# Patient Record
Sex: Female | Born: 1995 | Race: Black or African American | Hispanic: No | Marital: Single | Smoking: Never smoker
Health system: Southern US, Community
[De-identification: ages and names within clinical notes are randomized; demographics above are authoritative.]

## PROBLEM LIST (undated history)

## (undated) DIAGNOSIS — B009 Herpesviral infection, unspecified: Secondary | ICD-10-CM

## (undated) HISTORY — DX: Herpesviral infection, unspecified: B00.9

---

## 2020-04-12 ENCOUNTER — Other Ambulatory Visit: Payer: Self-pay

## 2020-04-12 ENCOUNTER — Emergency Department (HOSPITAL_COMMUNITY)
Admission: EM | Admit: 2020-04-12 | Discharge: 2020-04-12 | Disposition: A | Discharge status: Home / Self Care | Payer: PRIVATE HEALTH INSURANCE | Attending: Emergency Medicine | Admitting: Emergency Medicine

## 2020-04-12 ENCOUNTER — Encounter (HOSPITAL_COMMUNITY): Payer: Self-pay | Admitting: Emergency Medicine

## 2020-04-12 DIAGNOSIS — Z3A01 Less than 8 weeks gestation of pregnancy: Secondary | ICD-10-CM

## 2020-04-12 DIAGNOSIS — Z3201 Encounter for pregnancy test, result positive: Secondary | ICD-10-CM | POA: Diagnosis not present

## 2020-04-12 DIAGNOSIS — Z3200 Encounter for pregnancy test, result unknown: Secondary | ICD-10-CM | POA: Diagnosis present

## 2020-04-12 LAB — I-STAT BETA HCG BLOOD, ED (MC, WL, AP ONLY): I-stat hCG, quantitative: 302.6 m[IU]/mL — ABNORMAL HIGH (ref <5)

## 2020-04-12 NOTE — ED Notes (Signed)
Patient verbalizes understanding of discharge instructions. Opportunity for questioning and answers were provided. Armband removed by staff, pt discharged from ED.  

## 2020-04-12 NOTE — ED Provider Notes (Addendum)
[facility] EMERGENCY DEPARTMENT Provider Note   CSN: [id number] Arrival date & time: 04/12/20  1521     History Chief Complaint  Patient presents with  . Possible Pregnancy    Taylor [name] is a 24 y.o. female with no past medical history who presents to the ED for pregnancy test.  LMP 03/06/2020.  Patient reports that she had already contacted [facility] here in [place], [place] to arrange for appointment with an OB/GYN provider, however they were instructed to have a documented positive pregnancy test for Medicaid purposes.  Patient states that she is not having any complaints at this time and had a positive at home pregnancy test last week.  She denies any significant abdominal pain, vaginal bleeding, uncontrolled nausea or vomiting, or other symptoms.  HPI     History reviewed. No pertinent past medical history.  There are no problems to display for this patient.   History reviewed. No pertinent surgical history.   OB History   No obstetric history on file.     No family history on file.  Social History   Tobacco Use  . Smoking status: Not on file  Substance Use Topics  . Alcohol use: Not on file  . Drug use: Not on file    Home Medications Prior to Admission medications   Not on File    Allergies    Patient has no known allergies.  Review of Systems   Review of Systems  All other systems reviewed and are negative.   Physical Exam Updated Vital Signs BP (!) 129/100 (BP Location: Left Wrist)   Pulse 94   Temp 98.2 F (36.8 C) (Oral)   Resp 17   Ht 5\' 10"  (1.778 m)   Wt 85.7 kg   LMP 03/06/2020   SpO2 99%   BMI 27.12 kg/m   Physical Exam Vitals and nursing note reviewed. Exam conducted with a chaperone present.  Constitutional:      Appearance: Normal appearance.  HENT:     Head: Normocephalic and atraumatic.  Eyes:     General: No scleral icterus.    Conjunctiva/sclera: Conjunctivae normal.  Cardiovascular:   Rate and Rhythm: Normal rate.  Pulmonary:     Effort: Pulmonary effort is normal.  Skin:    General: Skin is dry.  Neurological:     Mental Status: She is alert.     GCS: GCS eye subscore is 4. GCS verbal subscore is 5. GCS motor subscore is 6.  Psychiatric:        Mood and Affect: Mood normal.        Behavior: Behavior normal.        Thought Content: Thought content normal.     ED Results / Procedures / Treatments   Labs (all labs ordered are listed, but only abnormal results are displayed) Labs Reviewed  I-STAT BETA HCG BLOOD, ED ([facility], [facility], [facility] ONLY) - Abnormal; Notable for the following components:      Result Value   I-stat hCG, quantitative 302.6 (*)    All other components within normal limits    EKG None  Radiology No results found.  Procedures Procedures (including critical care time)  Medications Ordered in ED Medications - No data to display  ED Course  I have reviewed the triage vital signs and the nursing notes.  Pertinent labs & imaging results that were available during my care of the patient were reviewed by me and considered in my medical decision making (see chart  for details).    MDM Rules/Calculators/A&P                      Patient presents to the ED in order to confirm pregnancy.  Beta-hCG elevated 302.6, consistent with her reported timeline.  She is already had a positive at home pregnancy test and is in the process of being established with an OB/GYN.  She is now G1P0.  She has no complaints at this time.  She is accompanied by her boyfriend.  They report that this was a planned pregnancy and are excited about the diagnosis.  No abdominal pain or vaginal bleeding so low suspicion for ectopic pregnancy or other pregnancy complications.  Final Clinical Impression(s) / ED Diagnoses Final diagnoses:  Less than [redacted] weeks gestation of pregnancy    Rx / DC Orders ED Discharge Orders    None       Corena Herter, PA-C 04/12/20 1639    Corena Herter, PA-C 04/12/20 1650    Wyvonnia Dusky, MD 04/13/20 [phone]

## 2020-04-12 NOTE — Discharge Instructions (Signed)
Please go to your OB/GYN appointment, as scheduled.  Return to the ED or seek immediate medical attention should you experience any new or worsening symptoms -in particular, fevers, vaginal bleeding, lower abdominal pain, or uncontrolled nausea and vomiting.

## 2020-04-12 NOTE — ED Triage Notes (Signed)
Pt requesting pregnancy test, LMP 03/06/20.

## 2020-04-16 ENCOUNTER — Inpatient Hospital Stay (HOSPITAL_COMMUNITY)
Admission: AD | Admit: 2020-04-16 | Discharge: 2020-04-16 | Disposition: A | Discharge status: Home / Self Care | Payer: Medicaid Other | Attending: Obstetrics & Gynecology | Admitting: Obstetrics & Gynecology

## 2020-04-16 ENCOUNTER — Other Ambulatory Visit: Payer: Self-pay

## 2020-04-16 ENCOUNTER — Encounter (HOSPITAL_COMMUNITY): Payer: Self-pay | Admitting: Obstetrics & Gynecology

## 2020-04-16 ENCOUNTER — Inpatient Hospital Stay (HOSPITAL_COMMUNITY): Payer: Medicaid Other

## 2020-04-16 DIAGNOSIS — O209 Hemorrhage in early pregnancy, unspecified: Secondary | ICD-10-CM

## 2020-04-16 DIAGNOSIS — Z3A01 Less than 8 weeks gestation of pregnancy: Secondary | ICD-10-CM | POA: Diagnosis not present

## 2020-04-16 DIAGNOSIS — O0281 Inappropriate change in quantitative human chorionic gonadotropin (hCG) in early pregnancy: Secondary | ICD-10-CM

## 2020-04-16 DIAGNOSIS — O3680X0 Pregnancy with inconclusive fetal viability, not applicable or unspecified: Secondary | ICD-10-CM | POA: Diagnosis not present

## 2020-04-16 LAB — CBC
HCT: 37.9 % (ref 36.0–46.0)
Hemoglobin: 12.2 g/dL (ref 12.0–15.0)
MCH: 29.7 pg (ref 26.0–34.0)
MCHC: 32.2 g/dL (ref 30.0–36.0)
MCV: 92.2 fL (ref 80.0–100.0)
Platelets: 247 10*3/uL (ref 150–400)
RBC: 4.11 MIL/uL (ref 3.87–5.11)
RDW: 12.6 % (ref 11.5–15.5)
WBC: 6.8 10*3/uL (ref 4.0–10.5)
nRBC: 0 % (ref 0.0–0.2)

## 2020-04-16 LAB — URINALYSIS, ROUTINE W REFLEX MICROSCOPIC
Bacteria, UA: NONE SEEN
Bilirubin Urine: NEGATIVE
Glucose, UA: NEGATIVE mg/dL
Ketones, ur: NEGATIVE mg/dL
Leukocytes,Ua: NEGATIVE
Nitrite: NEGATIVE
Protein, ur: NEGATIVE mg/dL
Specific Gravity, Urine: 1.012 (ref 1.005–1.030)
pH: 6 (ref 5.0–8.0)

## 2020-04-16 LAB — WET PREP, GENITAL
Clue Cells Wet Prep HPF POC: NONE SEEN
Sperm: NONE SEEN
Trich, Wet Prep: NONE SEEN
Yeast Wet Prep HPF POC: NONE SEEN

## 2020-04-16 LAB — HCG, QUANTITATIVE, PREGNANCY: hCG, Beta Chain, Quant, S: 278 m[IU]/mL — ABNORMAL HIGH (ref <5)

## 2020-04-16 NOTE — ED Provider Notes (Signed)
Patient seen in triage for screening.  She had a positive pregnancy test in the ED on 04/12/2020 and presents today with vaginal bleeding that began today.  She reports it is very light.  She denies any pain, nausea, vomiting, or fevers.  Physical Exam  BP (!) 144/83 (BP Location: Right Arm)   Pulse 83   Temp 99.1 F (37.3 C) (Oral)   Resp 16   SpO2 99%   Physical Exam Vitals and nursing note reviewed.  Constitutional:      General: She is not in acute distress.    Appearance: She is well-developed.  HENT:     Head: Normocephalic and atraumatic.  Eyes:     General:        Right eye: No discharge.        Left eye: No discharge.     Conjunctiva/sclera: Conjunctivae normal.  Neck:     Vascular: No JVD.     Trachea: No tracheal deviation.  Cardiovascular:     Rate and Rhythm: Normal rate and regular rhythm.  Pulmonary:     Effort: Pulmonary effort is normal.     Breath sounds: Normal breath sounds.  Abdominal:     General: Abdomen is flat. There is no distension.     Palpations: Abdomen is soft.     Tenderness: There is no abdominal tenderness. There is no right CVA tenderness, left CVA tenderness, guarding or rebound.  Skin:    Findings: No erythema.  Neurological:     Mental Status: She is alert.  Psychiatric:        Behavior: Behavior normal.     ED Course/Procedures     Procedures  MDM  Patient is hemodynamically stable, resting comfortably.  No tenderness on examination.  Spoke with Sam at the MAU who agrees to transfer.       Jeanie Sewer, PA-C 04/16/20 1832    Terald Sleeper, MD 04/16/20 2224

## 2020-04-16 NOTE — MAU Provider Note (Signed)
History     CSN: [id number]  Arrival date and time: 04/16/20 1819   First Provider Initiated Contact with Patient 04/16/20 1924     Chief Complaint  Patient presents with  . Vaginal Bleeding   HPI Taylor [name] is a 24 y.o. G1P0 at [redacted]w[redacted]d who presents to MAU with chief complaint of vaginal spotting. This is a new problem, onset today. Patient endorses sexual intercourse this morning, bleeding started shortly afterward. No pain during sex or at any time today. She denies dysuria, abdominal tenderness, flank pain, fever or recent illness.  She is s/p pregnancy confirmed with Quant hCG of 302 at [facility] on 04/12/2020.  OB History    Gravida  1   Para      Term      Preterm      AB      Living        SAB      TAB      Ectopic      Multiple      Live Births              History reviewed. No pertinent past medical history.  History reviewed. No pertinent surgical history.  No family history on file.  Social History   Tobacco Use  . Smoking status: Never Smoker  . Smokeless tobacco: Never Used  Substance Use Topics  . Alcohol use: Never  . Drug use: Never    Allergies: No Known Allergies  No medications prior to admission.    Review of Systems  Gastrointestinal: Negative for abdominal pain.  Genitourinary: Positive for vaginal bleeding.  Musculoskeletal: Negative for back pain.  Neurological: Negative for dizziness, syncope and weakness.  All other systems reviewed and are negative.  Physical Exam   Blood pressure (!) 144/83, pulse 83, temperature 98.3 F (36.8 C), temperature source Oral, resp. rate 16, last menstrual period 03/06/2020, SpO2 99 %.  Physical Exam  Nursing note and vitals reviewed. Constitutional: She is oriented to person, place, and time. She appears well-developed and well-nourished.  Cardiovascular: Normal rate, normal heart sounds and intact distal pulses.  Respiratory: Effort normal and breath sounds normal.  GI: Soft.  Bowel sounds are normal. She exhibits no distension. There is no abdominal tenderness. There is no rebound and no guarding.  Genitourinary:    No vaginal discharge.   Neurological: She is alert and oriented to person, place, and time.  Skin: Skin is warm and dry.  Psychiatric: She has a normal mood and affect. Her behavior is normal. Judgment and thought content normal.    MAU Course/MDM  Procedures  --Swabs collected via blind swab with RN. Speculum exam declined by patient --iStat Quant in [facility] 302.6 on 04/12/2020. Quant hCG 278 this evening. Discussed with patient significance of rise in Quant hCG q 48 hours in the first 10 weeks of pregnancy.  Patient Vitals for the past 24 hrs:  BP Temp Temp src Pulse Resp SpO2  04/16/20 2029 -- -- -- 81 -- --  04/16/20 1924 133/71 -- -- 94 -- --  04/16/20 1917 127/72 -- -- 89 -- --  04/16/20 1914 -- 98.3 F (36.8 C) Oral -- 16 99 %  04/16/20 1824 (!) 144/83 99.1 F (37.3 C) Oral 83 16 99 %   Results for orders placed or performed during the hospital encounter of 04/16/20 (from the past 24 hour(s))  CBC     Status: None   Collection Time: 04/16/20  7:17 PM  Result Value  Ref Range   WBC 6.8 4.0 - 10.5 K/uL   RBC 4.11 3.87 - 5.11 MIL/uL   Hemoglobin 12.2 12.0 - 15.0 g/dL   HCT 23.7 62.8 - 31.5 %   MCV 92.2 80.0 - 100.0 fL   MCH 29.7 26.0 - 34.0 pg   MCHC 32.2 30.0 - 36.0 g/dL   RDW 17.6 16.0 - 73.7 %   Platelets 247 150 - 400 K/uL   nRBC 0.0 0.0 - 0.2 %  hCG, quantitative, pregnancy     Status: Abnormal   Collection Time: 04/16/20  7:17 PM  Result Value Ref Range   hCG, Beta Chain, Quant, S 278 (H) <5 mIU/mL  ABO/Rh     Status: None   Collection Time: 04/16/20  7:17 PM  Result Value Ref Range   ABO/RH(D)      O POS Performed at [facility], [address], [place], [place] [address]   Urinalysis, Routine w reflex microscopic     Status: Abnormal   Collection Time: 04/16/20  7:26 PM  Result Value Ref Range   Color, Urine  YELLOW YELLOW   APPearance CLEAR CLEAR   Specific Gravity, Urine 1.012 1.005 - 1.030   pH 6.0 5.0 - 8.0   Glucose, UA NEGATIVE NEGATIVE mg/dL   Hgb urine dipstick MODERATE (A) NEGATIVE   Bilirubin Urine NEGATIVE NEGATIVE   Ketones, ur NEGATIVE NEGATIVE mg/dL   Protein, ur NEGATIVE NEGATIVE mg/dL   Nitrite NEGATIVE NEGATIVE   Leukocytes,Ua NEGATIVE NEGATIVE   RBC / HPF 6-10 0 - 5 RBC/hpf   WBC, UA 0-5 0 - 5 WBC/hpf   Bacteria, UA NONE SEEN NONE SEEN   Squamous Epithelial / LPF 0-5 0 - 5   Mucus PRESENT   Wet prep, genital     Status: Abnormal   Collection Time: 04/16/20  7:34 PM   Specimen: Vaginal  Result Value Ref Range   Yeast Wet Prep HPF POC NONE SEEN NONE SEEN   Trich, Wet Prep NONE SEEN NONE SEEN   Clue Cells Wet Prep HPF POC NONE SEEN NONE SEEN   WBC, Wet Prep HPF POC MODERATE (A) NONE SEEN   Sperm NONE SEEN    Assessment and Plan  --25 y.o. G1P0 at 109w6d with pregnancy of unknown location --Concern for slightly decreasing quant hCG --O POS, Rhogam not indicated --Discharge home in stable condition  F/U: --State Quant hCG Tuesday morning at [facility]  Calvert Cantor, CNM 04/16/2020, 8:35 PM

## 2020-04-16 NOTE — Discharge Instructions (Signed)
First Trimester of Pregnancy  The first trimester of pregnancy is from week 1 until the end of week 13 (months 1 through 3). During this time, your baby will begin to develop inside you. At 6-8 weeks, the eyes and face are formed, and the heartbeat can be seen on ultrasound. At the end of 12 weeks, all the baby's organs are formed. Prenatal care is all the medical care you receive before the birth of your baby. Make sure you get good prenatal care and follow all of your doctor's instructions. Follow these instructions at home: Medicines  Take over-the-counter and prescription medicines only as told by your doctor. Some medicines are safe and some medicines are not safe during pregnancy.  Take a prenatal vitamin that contains at least 600 micrograms (mcg) of folic acid.  If you have trouble pooping (constipation), take medicine that will make your stool soft (stool softener) if your doctor approves. Eating and drinking   Eat regular, healthy meals.  Your doctor will tell you the amount of weight gain that is right for you.  Avoid raw meat and uncooked cheese.  If you feel sick to your stomach (nauseous) or throw up (vomit): ? Eat 4 or 5 small meals a day instead of 3 large meals. ? Try eating a few soda crackers. ? Drink liquids between meals instead of during meals.  To prevent constipation: ? Eat foods that are high in fiber, like fresh fruits and vegetables, whole grains, and beans. ? Drink enough fluids to keep your pee (urine) clear or pale yellow. Activity  Exercise only as told by your doctor. Stop exercising if you have cramps or pain in your lower belly (abdomen) or low back.  Do not exercise if it is too hot, too humid, or if you are in a place of great height (high altitude).  Try to avoid standing for long periods of time. Move your legs often if you must stand in one place for a long time.  Avoid heavy lifting.  Wear low-heeled shoes. Sit and stand up  straight.  You can have sex unless your doctor tells you not to. Relieving pain and discomfort  Wear a good support bra if your breasts are sore.  Take warm water baths (sitz baths) to soothe pain or discomfort caused by hemorrhoids. Use hemorrhoid cream if your doctor says it is okay.  Rest with your legs raised if you have leg cramps or low back pain.  If you have puffy, bulging veins (varicose veins) in your legs: ? Wear support hose or compression stockings as told by your doctor. ? Raise (elevate) your feet for 15 minutes, 3-4 times a day. ? Limit salt in your food. Prenatal care  Schedule your prenatal visits by the twelfth week of pregnancy.  Write down your questions. Take them to your prenatal visits.  Keep all your prenatal visits as told by your doctor. This is important. Safety  Wear your seat belt at all times when driving.  Make a list of emergency phone numbers. The list should include numbers for family, friends, the hospital, and police and fire departments. General instructions  Ask your doctor for a referral to a local prenatal class. Begin classes no later than at the start of month 6 of your pregnancy.  Ask for help if you need counseling or if you need help with nutrition. Your doctor can give you advice or tell you where to go for help.  Do not use hot tubs, steam   rooms, or saunas.  Do not douche or use tampons or scented sanitary pads.  Do not cross your legs for long periods of time.  Avoid all herbs and alcohol. Avoid drugs that are not approved by your doctor.  Do not use any tobacco products, including cigarettes, chewing tobacco, and electronic cigarettes. If you need help quitting, ask your doctor. You may get counseling or other support to help you quit.  Avoid cat litter boxes and soil used by cats. These carry germs that can cause birth defects in the baby and can cause a loss of your baby (miscarriage) or stillbirth.  Visit your dentist.  At home, brush your teeth with a soft toothbrush. Be gentle when you floss. Contact a doctor if:  You are dizzy.  You have mild cramps or pressure in your lower belly.  You have a nagging pain in your belly area.  You continue to feel sick to your stomach, you throw up, or you have watery poop (diarrhea).  You have a bad smelling fluid coming from your vagina.  You have pain when you pee (urinate).  You have increased puffiness (swelling) in your face, hands, legs, or ankles. Get help right away if:  You have a fever.  You are leaking fluid from your vagina.  You have spotting or bleeding from your vagina.  You have very bad belly cramping or pain.  You gain or lose weight rapidly.  You throw up blood. It may look like coffee grounds.  You are around people who have [place] measles, fifth disease, or chickenpox.  You have a very bad headache.  You have shortness of breath.  You have any kind of trauma, such as from a fall or a car accident. Summary  The first trimester of pregnancy is from week 1 until the end of week 13 (months 1 through 3).  To take care of yourself and your unborn baby, you will need to eat healthy meals, take medicines only if your doctor tells you to do so, and do activities that are safe for you and your baby.  Keep all follow-up visits as told by your doctor. This is important as your doctor will have to ensure that your baby is healthy and growing well. This information is not intended to replace advice given to you by your health care provider. Make sure you discuss any questions you have with your health care provider. Document Revised: 03/12/2019 Document Reviewed: 11/27/2016 Elsevier Patient Education  2020 Elsevier Inc.    Vaginal Bleeding During Pregnancy, First Trimester  A small amount of bleeding (spotting) from the vagina is common during early pregnancy. Sometimes the bleeding is normal and does not cause problems. At other  times, though, bleeding may be a sign of something serious. Tell your doctor about any bleeding from your vagina right away. Follow these instructions at home: Activity  Follow your doctor's instructions about how active you can be.  If needed, make plans for someone to help with your normal activities.  Do not have sex or orgasms until your doctor says that this is safe. General instructions  Take over-the-counter and prescription medicines only as told by your doctor.  Watch your condition for any changes.  Write down: ? The number of pads you use each day. ? How often you change pads. ? How soaked (saturated) your pads are.  Do not use tampons.  Do not douche.  If you pass any tissue from your vagina, save it to show  to your doctor.  Keep all follow-up visits as told by your doctor. This is important. Contact a doctor if:  You have vaginal bleeding at any time while you are pregnant.  You have cramps.  You have a fever. Get help right away if:  You have very bad cramps in your back or belly (abdomen).  You pass large clots or a lot of tissue from your vagina.  Your bleeding gets worse.  You feel light-headed.  You feel weak.  You pass out (faint).  You have chills.  You are leaking fluid from your vagina.  You have a gush of fluid from your vagina. Summary  Sometimes vaginal bleeding during pregnancy is normal and does not cause problems. At other times, bleeding may be a sign of something serious.  Tell your doctor about any bleeding from your vagina right away.  Follow your doctor's instructions about how active you can be. You may need someone to help you with your normal activities. This information is not intended to replace advice given to you by your health care provider. Make sure you discuss any questions you have with your health care provider. Document Revised: 03/10/2019 Document Reviewed: 02/20/2017 Elsevier Patient Education  [place] Medications in Pregnancy   Acne: Benzoyl Peroxide Salicylic Acid  Backache/Headache: Tylenol: 2 regular strength every 4 hours OR              2 Extra strength every 6 hours  Colds/Coughs/Allergies: Benadryl (alcohol free) 25 mg every 6 hours as needed Breath right strips Claritin Cepacol throat lozenges Chloraseptic throat spray Cold-Eeze- up to three times per day Cough drops, alcohol free Flonase (by prescription only) Guaifenesin Mucinex Robitussin DM (plain only, alcohol free) Saline nasal spray/drops Sudafed (pseudoephedrine) & Actifed ** use only after [redacted] weeks gestation and if you do not have high blood pressure Tylenol Vicks Vaporub Zinc lozenges Zyrtec   Constipation: Colace Ducolax suppositories Fleet enema Glycerin suppositories Metamucil Milk of magnesia Miralax Senokot Smooth move tea  Diarrhea: Kaopectate Imodium A-D  *NO pepto Bismol  Hemorrhoids: Anusol Anusol HC Preparation H Tucks  Indigestion: Tums Maalox Mylanta Zantac  Pepcid  Insomnia: Benadryl (alcohol free) 25mg  every 6 hours as needed Tylenol PM Unisom, no Gelcaps  Leg Cramps: Tums MagGel  Nausea/Vomiting:  Bonine Dramamine Emetrol Ginger extract Sea bands Meclizine  Nausea medication to take during pregnancy:  Unisom (doxylamine succinate 25 mg tablets) Take one tablet daily at bedtime. If symptoms are not adequately controlled, the dose can be increased to a maximum recommended dose of two tablets daily (1/2 tablet in the morning, 1/2 tablet mid-afternoon and one at bedtime). Vitamin B6 100mg  tablets. Take one tablet twice a day (up to 200 mg per day).  Skin Rashes: Aveeno products Benadryl cream or 25mg  every 6 hours as needed Calamine Lotion 1% cortisone cream  Yeast infection: Gyne-lotrimin 7 Monistat 7   **If taking multiple medications, please check labels to avoid duplicating the same active ingredients **take  medication as directed on the label ** Do not exceed 4000 mg of tylenol in 24 hours **Do not take medications that contain aspirin or ibuprofen

## 2020-04-16 NOTE — ED Triage Notes (Signed)
Pt seen here on 5/11 with positive pregnancy test. Pt c/o vaginal bleeding that started today.

## 2020-04-16 NOTE — MAU Note (Signed)
Pt reports to MAU from ED with some light VB that is red, denies pain.

## 2020-04-18 ENCOUNTER — Other Ambulatory Visit: Payer: Self-pay | Admitting: Advanced Practice Midwife

## 2020-04-18 LAB — ABO/RH: ABO/RH(D): O POS

## 2020-04-18 LAB — GC/CHLAMYDIA PROBE AMP (~~LOC~~) NOT AT ARMC
Chlamydia: POSITIVE — AB
Comment: NEGATIVE
Comment: NORMAL
Neisseria Gonorrhea: NEGATIVE

## 2020-04-18 MED ORDER — AZITHROMYCIN 500 MG PO TABS: 1000.0000 mg | ORAL_TABLET | Freq: Once | ORAL | 0 refills | Status: AC

## 2020-04-18 NOTE — Progress Notes (Signed)
+   Chlamydia, patient notified via phone. Discussed recommendation for treatment and immediate partner treatment due to rates of reinfection. 100% condom use advised due to risk of complications. Patient verbalized understanding. Has appointment for repeat stat Quant hCG at [facility] tomorrow 04/19/2020.  Clayton Bibles, MSN, CNM Certified Nurse Midwife, [employer] for [employer], [facility] 04/18/20 3:03 PM

## 2020-04-19 ENCOUNTER — Other Ambulatory Visit (INDEPENDENT_AMBULATORY_CARE_PROVIDER_SITE_OTHER): Payer: PRIVATE HEALTH INSURANCE | Admitting: *Deleted

## 2020-04-19 ENCOUNTER — Other Ambulatory Visit: Payer: Self-pay

## 2020-04-19 ENCOUNTER — Telehealth: Payer: Self-pay | Admitting: Advanced Practice Midwife

## 2020-04-19 ENCOUNTER — Encounter: Payer: Self-pay | Admitting: Advanced Practice Midwife

## 2020-04-19 VITALS — BP 126/77 | HR 87 | Temp 98.4°F | Ht 70.0 in | Wt 198.0 lb

## 2020-04-19 DIAGNOSIS — A749 Chlamydial infection, unspecified: Secondary | ICD-10-CM | POA: Insufficient documentation

## 2020-04-19 DIAGNOSIS — O3680X0 Pregnancy with inconclusive fetal viability, not applicable or unspecified: Secondary | ICD-10-CM

## 2020-04-19 LAB — BETA HCG QUANT (REF LAB): hCG Quant: 183 m[IU]/mL

## 2020-04-19 NOTE — Progress Notes (Signed)
   Taylor [name] presents to [facility] for follow-up quant hCG blood draw today. She was seen in MAU for vaginal bleeding on 04/16/2020. Beta Hcg on 04/16/20 278 and 04/12/20 302.6. Patient denies abdominal pain endorses bleeding (spotting) today. Discussed with patient, we are following hCG levels today. Results will be back in approximately 2 hours. Valid contact number for patient confirmed. I will call the patient with results.     Clovis Pu 04/19/2020 8:44 AM

## 2020-04-19 NOTE — Telephone Encounter (Signed)
Patient called Taylor [name] and asked to speak with me. Denies pain, states bleeding is negligible with nothing visible on pad. Reviewed hCG results and confirmed they are trending down, suggesting non-viable pregnancy. Reviewed all labs from 04/16/2020, protocol involving following quant hCG until <5 with repeat ultrasound PRN. Patient and boyfriend participating in phone call. Verbalized understanding and deny questions at end of our 15 minute call.  F/U: Patient returning to [facility] Thursday 05/20 for repeat Quant hCG  Clayton Bibles, MSN, CNM Certified Nurse Midwife, [facility] 04/19/20 3:53 PM

## 2020-04-19 NOTE — Progress Notes (Signed)
   Called patient regarding stat Beta Hcg. Patient verified DOB. Informed patient that blood level has decreased. Patient asked what does that mean, informed that patient that she is currently having a miscarriage and she should come back to clinic on Thursday for repeat lab draw. Explained to patient the reason for repeat lab and when to return to MAU. Patient voiced understand. Appointment Thursday 04/22/2020 at 9 AM for stat Beta Hcg.  Clovis Pu, RN

## 2020-04-21 ENCOUNTER — Other Ambulatory Visit (INDEPENDENT_AMBULATORY_CARE_PROVIDER_SITE_OTHER): Payer: PRIVATE HEALTH INSURANCE | Admitting: *Deleted

## 2020-04-21 ENCOUNTER — Telehealth: Payer: PRIVATE HEALTH INSURANCE | Admitting: Emergency Medicine

## 2020-04-21 ENCOUNTER — Other Ambulatory Visit: Payer: Self-pay

## 2020-04-21 ENCOUNTER — Telehealth: Payer: Self-pay | Admitting: Obstetrics and Gynecology

## 2020-04-21 ENCOUNTER — Inpatient Hospital Stay (HOSPITAL_COMMUNITY)
Admission: AD | Admit: 2020-04-21 | Discharge: 2020-04-21 | Disposition: A | Discharge status: Home / Self Care | Payer: PRIVATE HEALTH INSURANCE | Attending: Obstetrics and Gynecology | Admitting: Obstetrics and Gynecology

## 2020-04-21 ENCOUNTER — Inpatient Hospital Stay (HOSPITAL_COMMUNITY): Payer: PRIVATE HEALTH INSURANCE

## 2020-04-21 VITALS — BP 127/79 | HR 85 | Temp 98.1°F | Wt 198.8 lb

## 2020-04-21 DIAGNOSIS — O3680X0 Pregnancy with inconclusive fetal viability, not applicable or unspecified: Secondary | ICD-10-CM

## 2020-04-21 DIAGNOSIS — N898 Other specified noninflammatory disorders of vagina: Secondary | ICD-10-CM

## 2020-04-21 DIAGNOSIS — Z331 Pregnant state, incidental: Secondary | ICD-10-CM | POA: Diagnosis present

## 2020-04-21 LAB — CBC WITH DIFFERENTIAL/PLATELET
Abs Immature Granulocytes: 0.01 10*3/uL (ref 0.00–0.07)
Basophils Absolute: 0 10*3/uL (ref 0.0–0.1)
Basophils Relative: 1 %
Eosinophils Absolute: 0.1 10*3/uL (ref 0.0–0.5)
Eosinophils Relative: 2 %
HCT: 39.4 % (ref 36.0–46.0)
Hemoglobin: 12.5 g/dL (ref 12.0–15.0)
Immature Granulocytes: 0 %
Lymphocytes Relative: 51 %
Lymphs Abs: 2.6 10*3/uL (ref 0.7–4.0)
MCH: 29.7 pg (ref 26.0–34.0)
MCHC: 31.7 g/dL (ref 30.0–36.0)
MCV: 93.6 fL (ref 80.0–100.0)
Monocytes Absolute: 0.4 10*3/uL (ref 0.1–1.0)
Monocytes Relative: 7 %
Neutro Abs: 2.1 10*3/uL (ref 1.7–7.7)
Neutrophils Relative %: 39 %
Platelets: 255 10*3/uL (ref 150–400)
RBC: 4.21 MIL/uL (ref 3.87–5.11)
RDW: 12.7 % (ref 11.5–15.5)
WBC: 5.2 10*3/uL (ref 4.0–10.5)
nRBC: 0 % (ref 0.0–0.2)

## 2020-04-21 LAB — COMPREHENSIVE METABOLIC PANEL
ALT: 20 U/L (ref 0–44)
AST: 21 U/L (ref 15–41)
Albumin: 3.9 g/dL (ref 3.5–5.0)
Alkaline Phosphatase: 50 U/L (ref 38–126)
Anion gap: 8 (ref 5–15)
BUN: 6 mg/dL (ref 6–20)
CO2: 27 mmol/L (ref 22–32)
Calcium: 9.1 mg/dL (ref 8.9–10.3)
Chloride: 102 mmol/L (ref 98–111)
Creatinine, Ser: 0.95 mg/dL (ref 0.44–1.00)
GFR calc Af Amer: >60 mL/min (ref >60)
GFR calc non Af Amer: >60 mL/min (ref >60)
Glucose, Bld: 93 mg/dL (ref 70–99)
Potassium: 3.9 mmol/L (ref 3.5–5.1)
Sodium: 137 mmol/L (ref 135–145)
Total Bilirubin: 0.7 mg/dL (ref 0.3–1.2)
Total Protein: 7.2 g/dL (ref 6.5–8.1)

## 2020-04-21 LAB — BETA HCG QUANT (REF LAB): hCG Quant: 229 m[IU]/mL

## 2020-04-21 NOTE — MAU Provider Note (Signed)
Ms. Taylor [name]  is a 24 y.o. G1P0 at [redacted]w[redacted]d who presents to MAU today for evaluation of abnormal serial quant HCGs. She was originally seen in the ED for a pregnancy test on 04/12/20 and qhcg was 302. She was seen again on 04/16/20 in MAU for spotting and had [place] which showed IUGS but no YS or FP, qhcg was 278. Serial qhcg q48 hrs were 183 on 04/19/20 and 229 today. She denies pain, vaginal bleeding or fever today.   OB History  Gravida Para Term Preterm AB Living  1            SAB TAB Ectopic Multiple Live Births               # Outcome Date GA Lbr Len/2nd Weight Sex Delivery Anes PTL Lv  1 Current             No past medical history on file.  ROS: no VB no pain  BP 120/76   Pulse 88   Resp 18   Ht 5\' 10"  (1.778 m)   Wt 89.4 kg   LMP 03/06/2020   BMI 28.27 kg/m   CONSTITUTIONAL: Well-developed, well-nourished female in no acute distress.  MUSCULOSKELETAL: Normal range of motion.  CARDIOVASCULAR: Regular heart rate RESPIRATORY: Normal effort NEUROLOGICAL: Alert and oriented to person, place, and time.  SKIN: Not diaphoretic. No erythema. No pallor. PSYCH: Normal mood and affect. Normal behavior. Normal judgment and thought content.  Results for orders placed or performed during the hospital encounter of 04/21/20 (from the past 24 hour(s))  CBC WITH DIFFERENTIAL     Status: None   Collection Time: 04/21/20  1:57 PM  Result Value Ref Range   WBC 5.2 4.0 - 10.5 K/uL   RBC 4.21 3.87 - 5.11 MIL/uL   Hemoglobin 12.5 12.0 - 15.0 g/dL   HCT 04/23/20 98.3 - 38.2 %   MCV 93.6 80.0 - 100.0 fL   MCH 29.7 26.0 - 34.0 pg   MCHC 31.7 30.0 - 36.0 g/dL   RDW 50.5 39.7 - 67.3 %   Platelets 255 150 - 400 K/uL   nRBC 0.0 0.0 - 0.2 %   Neutrophils Relative % 39 %   Neutro Abs 2.1 1.7 - 7.7 K/uL   Lymphocytes Relative 51 %   Lymphs Abs 2.6 0.7 - 4.0 K/uL   Monocytes Relative 7 %   Monocytes Absolute 0.4 0.1 - 1.0 K/uL   Eosinophils Relative 2 %   Eosinophils Absolute 0.1 0.0 - 0.5 K/uL    Basophils Relative 1 %   Basophils Absolute 0.0 0.0 - 0.1 K/uL   Immature Granulocytes 0 %   Abs Immature Granulocytes 0.01 0.00 - 0.07 K/uL  Comprehensive metabolic panel     Status: None   Collection Time: 04/21/20  1:57 PM  Result Value Ref Range   Sodium 137 135 - 145 mmol/L   Potassium 3.9 3.5 - 5.1 mmol/L   Chloride 102 98 - 111 mmol/L   CO2 27 22 - 32 mmol/L   Glucose, Bld 93 70 - 99 mg/dL   BUN 6 6 - 20 mg/dL   Creatinine, Ser 04/23/20 0.44 - 1.00 mg/dL   Calcium 9.1 8.9 - 3.79 mg/dL   Total Protein 7.2 6.5 - 8.1 g/dL   Albumin 3.9 3.5 - 5.0 g/dL   AST 21 15 - 41 U/L   ALT 20 0 - 44 U/L   Alkaline Phosphatase 50 38 - 126 U/L   Total Bilirubin  0.7 0.3 - 1.2 mg/dL   GFR calc non Af Amer >60 >60 mL/min   GFR calc Af Amer >60 >60 mL/min   Anion gap 8 5 - 15   US OB Transvaginal  Result Date: 04/21/2020 CLINICAL DATA:  Pregnancy of unknown location. No pain or vaginal bleeding. Quantitative beta HCG 229 today (04/16/2020-278, 04/19/2020-183). Estimated gestational age per LMP 6 weeks 4 days. EXAM: TRANSVAGINAL OB ULTRASOUND TECHNIQUE: Transvaginal ultrasound was performed for complete evaluation of the gestation as well as the maternal uterus, adnexal regions, and pelvic cul-de-sac. COMPARISON:  04/16/2020 FINDINGS: Intrauterine gestational sac: Single oval cystic structure over the endometrium unchanged and may represent a gestational sac. Yolk sac:  Not visualized. Embryo:  Not visualized. Cardiac Activity: Not visualized. Heart Rate: Not visualized.  Bpm MSD: 2.3 mm   4 w   6 d Subchorionic hemorrhage:  None visualized. Maternal uterus/adnexae: Ovaries are normal size, shape and position with normal color Doppler bilaterally. No extra ovarian abnormality within the adnexa. No free fluid. IMPRESSION: 1. Possible intrauterine gestational sac with mean sac diameter compatible with estimated gestational age [redacted] weeks 6 days which is unchanged. No other findings to suggest ectopic pregnancy.  Given clinical findings, this likely represents failed pregnancy, although normal early pregnancy is still possible. Consider continued serial quantitative beta HCG and follow-up ultrasound 7-10 days. Electronically Signed   By: Marin Olp M.D.   On: 04/21/2020 16:23   MDM: Review of quants: 04/12/20 qhcg 302                                         04/16/20 qhcg 278; +IUGS, no YS or FP                                         04/19/20 qhcg 183                                         04/21/20 qhcg 229  Consult with Dr. Elly Modena, recommends repeating imaging. Possible IUGS seen on [place] again today. Consult with Dr. Elly Modena, recommends rpt qhcg in 2 days in MAU. Discussed plan with pt. Stable for discharge home.   A: 1. Pregnancy of unknown anatomic location    P: Discharge home Ectopic precautions discussed Patient will return for follow-up quant HCG in MAU on 04/23/20 Patient may return to MAU as needed or if her condition were to change or worsen   Allergies as of 04/21/2020   No Known Allergies     Medication List    You have not been prescribed any medications.     Julianne Handler, CNM 04/21/2020 4:39 PM

## 2020-04-21 NOTE — Progress Notes (Signed)
   Ms. Taylor [name] presents to [facility] for follow-up quant hCG blood draw today. She was seen in MAU for vaginal bleeding on 04/16/2020. Patient denies/endorses abdominal pain or bleeding today. Discussed with patient, we are following hCG levels today. Beta Hcg on 04/16/2020 278, 04/12/2020 302.6 and 04/19/2020 183. Results will be back in approximately 2 hours. Valid contact number for patient confirmed. I will call the patient with results.     Clovis Pu 04/21/2020 8:42 AM

## 2020-04-21 NOTE — MAU Note (Signed)
Pt sent from office for MTX for ectopic pregnancy. Denies any vag bleeding or pain at this time.

## 2020-04-21 NOTE — Progress Notes (Signed)
Vaginal discharge in the setting of possible pregnancy is not something that can be properly evaluated or treated during an e-visit. Please make an appointment with your doctor or OBGYN.  Based on what you shared with me, I feel your condition warrants further evaluation and I recommend that you be seen for a face to face visit.  Please contact your primary care physician practice to be seen. Many offices offer virtual options to be seen via video if you are not comfortable going in person to a medical facility at this time.  If you do not have a PCP, [facility] offers a free physician referral service available at [phone]. Our trained staff has the experience, knowledge and resources to put you in touch with a physician who is right for you.   You also have the option of a video visit through [email or web address]  If you are having a true medical emergency please call 911.  NOTE: If you entered your credit card information for this eVisit, you will not be charged. You may see a "hold" on your card for the $35 but that hold will drop off and you will not have a charge processed.  Your e-visit answers were reviewed by a board certified advanced clinical practitioner to complete your personal care plan.  Thank you for using e-Visits.  Approximately 5 minutes was used in reviewing the patient's chart, questionnaire, prescribing medications, and documentation.

## 2020-04-21 NOTE — Telephone Encounter (Signed)
*  Consult with Dr. Jolayne Panther @ 1139 - notified of patient's complaints, lab & U/S results, recommended tx plan have patient report to MAU for MTX.  TC to patient at 1141: reviewed lab results, discussed Dr. Deretha Emory recommendation to go to MAU for MTX injection. Explained ectopic pregnancy, rationale for MTX use for ectopic pregnancy. Advised that taking MTX will not cause her not to get pregnant again. Stressed the importance of going now to MAU for MTX. Patient verbalized an understanding of the plan of care and agrees.   MAU Provider, Donette Larry, CNM & Harley Alto, RN  Notified of patient coming.  Raelyn Mora, CNM

## 2020-04-23 ENCOUNTER — Inpatient Hospital Stay (HOSPITAL_COMMUNITY)
Admission: AD | Admit: 2020-04-23 | Discharge: 2020-04-23 | Disposition: A | Discharge status: Home / Self Care | Payer: PRIVATE HEALTH INSURANCE | Attending: Obstetrics and Gynecology | Admitting: Obstetrics and Gynecology

## 2020-04-23 ENCOUNTER — Other Ambulatory Visit: Payer: Self-pay

## 2020-04-23 DIAGNOSIS — Z3A01 Less than 8 weeks gestation of pregnancy: Secondary | ICD-10-CM

## 2020-04-23 DIAGNOSIS — O0281 Inappropriate change in quantitative human chorionic gonadotropin (hCG) in early pregnancy: Secondary | ICD-10-CM | POA: Diagnosis not present

## 2020-04-23 DIAGNOSIS — Z3401 Encounter for supervision of normal first pregnancy, first trimester: Secondary | ICD-10-CM | POA: Insufficient documentation

## 2020-04-23 DIAGNOSIS — O3680X0 Pregnancy with inconclusive fetal viability, not applicable or unspecified: Secondary | ICD-10-CM

## 2020-04-23 LAB — HCG, QUANTITATIVE, PREGNANCY: hCG, Beta Chain, Quant, S: 315 m[IU]/mL — ABNORMAL HIGH (ref <5)

## 2020-04-23 NOTE — Discharge Instructions (Signed)

## 2020-04-23 NOTE — MAU Provider Note (Signed)
History   Chief Complaint:  Follow-up  Taylor [name] is  24 y.o. G1P0 Patient's last menstrual period was 03/06/2020.Marland Kitchen Patient is here for follow up of quantitative HCG and ongoing surveillance of pregnancy status. She is [redacted]w[redacted]d weeks gestation  by LMP.    Since her last visit, the patient is without new complaint. The patient reports bleeding as  none now.  She denies any pain.  General ROS:  negative  Her previous Quantitative HCG values are:  5/11: 302 5/15: 278 5/18: 183 5/20: 229  Physical Exam   Blood pressure 126/69, pulse 80, temperature 99.3 F (37.4 C), temperature source Oral, resp. rate 18, last menstrual period 03/06/2020, SpO2 99 %.  Focused Gynecological Exam: examination not indicated  Labs: Results for orders placed or performed during the hospital encounter of 04/23/20 (from the past 24 hour(s))  hCG, quantitative, pregnancy   Collection Time: 04/23/20 12:38 PM  Result Value Ref Range   hCG, Beta Chain, Quant, S 315 (H) <5 mIU/mL   Assessment:   1. Pregnancy of unknown anatomic location     Consulted with Dr. Jolayne Panther- recommends MTX for abnormally rising HCGs. Lengthy discussion with patient regarding results and recommendations. Patient concerned because levels are rising and does not want MTX if pregnancy is normal. Fears validated and discussed hormone levels indicative of abnormally developing pregnancy, likely ectopic and reviewed normal pregnancy levels. Patient refuses MTX and wants to continue to repeat HCGs until a definitive diagnosis can be made. Lengthy discussion with patient regarding warning signs of ectopic and when to immediately return to MAU.  Plan: -Discharge home in stable condition -Strict ectopic precautions discussed -Patient advised to follow-up with [facility] on  Monday at 9a -Patient may return to MAU as needed or if her condition were to change or worsen  Rolm Bookbinder, CNM 04/23/2020, 1:28 PM

## 2020-04-23 NOTE — MAU Note (Signed)
Taylor [name] is a 24 y.o. at [redacted]w[redacted]d here in MAU reporting:  For follow up lab work from MAU visit on 04/21/20. Denies vaginal bleeding or pain.   Vitals:   04/23/20 1234  BP: 126/69  Pulse: 80  Resp: 18  Temp: 99.3 F (37.4 C)  SpO2: 99%

## 2020-04-24 ENCOUNTER — Telehealth: Payer: Self-pay | Admitting: Advanced Practice Midwife

## 2020-04-24 NOTE — Telephone Encounter (Signed)
Patient and partner contacted per their request to speak to me. Lab values, recommendation for MTX reviewed with both parties. Affirmed recommendation from C. Neill and Dr Jolayne Panther. Discussed acuity or ectopic pregnancies, potential impact on health and fertility. Patient verbalizes intent to return to MAU tomorrow 04/25/2020 for MTX administration.  Clayton Bibles, MSN, CNM Certified Nurse Midwife, [employer] for [employer], [facility] 04/24/20 8:22 PM

## 2020-04-25 ENCOUNTER — Inpatient Hospital Stay (HOSPITAL_COMMUNITY)
Admission: AD | Admit: 2020-04-25 | Discharge: 2020-04-25 | Disposition: A | Discharge status: Home / Self Care | Payer: PRIVATE HEALTH INSURANCE | Attending: Obstetrics and Gynecology | Admitting: Obstetrics and Gynecology

## 2020-04-25 ENCOUNTER — Other Ambulatory Visit: Payer: PRIVATE HEALTH INSURANCE

## 2020-04-25 ENCOUNTER — Other Ambulatory Visit: Payer: Self-pay

## 2020-04-25 DIAGNOSIS — Z3A00 Weeks of gestation of pregnancy not specified: Secondary | ICD-10-CM | POA: Diagnosis not present

## 2020-04-25 DIAGNOSIS — O3680X0 Pregnancy with inconclusive fetal viability, not applicable or unspecified: Secondary | ICD-10-CM | POA: Insufficient documentation

## 2020-04-25 DIAGNOSIS — O0281 Inappropriate change in quantitative human chorionic gonadotropin (hCG) in early pregnancy: Secondary | ICD-10-CM | POA: Diagnosis not present

## 2020-04-25 MED ORDER — METHOTREXATE FOR ECTOPIC PREGNANCY
50.0000 mg/m2 | Freq: Once | INTRAMUSCULAR | Status: AC
  Administered 2020-04-25: 105 mg via INTRAMUSCULAR
  Filled 2020-04-25: qty 1

## 2020-04-25 NOTE — MAU Provider Note (Signed)
History     CSN: [id number]  Arrival date and time: 04/25/20 1016   None     Chief Complaint  Patient presents with  . methotrexate injection   HPI   [name] is a 24 y.o. female; LMP 03/06/20 here in MAU for MTX for pregnancy of unknown location; presumed ectopic pregnancy. She is having no pain or bleeding. She was counseling by Thalia Bloodgood CNM regarding MTX and has no questions about the treatment at this time.  She initially refused MTX treatment however is agreeable today.   Her previous Quantitative Hcg levels are:  5/11: 302 5/15: 278 5/18: 183 5/20: 229 5/22: 315  OB History    Gravida  1   Para      Term      Preterm      AB      Living        SAB      TAB      Ectopic      Multiple      Live Births              No past medical history on file.  No past surgical history on file.  No family history on file.  Social History   Tobacco Use  . Smoking status: Never Smoker  . Smokeless tobacco: Never Used  Substance Use Topics  . Alcohol use: Never  . Drug use: Never    Allergies: No Known Allergies  No medications prior to admission.    Recent Results (from the past 2160 hour(s))  I-Stat beta hCG blood, ED     Status: Abnormal   Collection Time: 04/12/20  3:54 PM  Result Value Ref Range   I-stat hCG, quantitative 302.6 (H) <5 mIU/mL   Comment 3            Comment:   GEST. AGE      CONC.  (mIU/mL)   <=1 WEEK        5 - 50     2 WEEKS       50 - 500     3 WEEKS       100 - 10,000     4 WEEKS     1,000 - 30,000        FEMALE AND NON-PREGNANT FEMALE:     LESS THAN 5 mIU/mL   CBC     Status: None   Collection Time: 04/16/20  7:17 PM  Result Value Ref Range   WBC 6.8 4.0 - 10.5 K/uL   RBC 4.11 3.87 - 5.11 MIL/uL   Hemoglobin 12.2 12.0 - 15.0 g/dL   HCT 23.3 61.2 - 24.4 %   MCV 92.2 80.0 - 100.0 fL   MCH 29.7 26.0 - 34.0 pg   MCHC 32.2 30.0 - 36.0 g/dL   RDW 97.5 30.0 - 51.1 %   Platelets 247 150 - 400 K/uL    nRBC 0.0 0.0 - 0.2 %    Comment: Performed at [facility], [address], [place], [place] [address]  hCG, quantitative, pregnancy     Status: Abnormal   Collection Time: 04/16/20  7:17 PM  Result Value Ref Range   hCG, Beta Chain, Quant, S 278 (H) <5 mIU/mL    Comment:          GEST. AGE      CONC.  (mIU/mL)   <=1 WEEK        5 - 50  2 WEEKS       50 - 500     3 WEEKS       100 - 10,000     4 WEEKS     1,000 - 30,000     5 WEEKS     3,500 - 115,000   6-8 WEEKS     12,000 - 270,000    12 WEEKS     15,000 - 220,000        FEMALE AND NON-PREGNANT FEMALE:     LESS THAN 5 mIU/mL Performed at [facility], [address] [place], [place] [address]   ABO/Rh     Status: None   Collection Time: 04/16/20  7:17 PM  Result Value Ref Range   ABO/RH(D) O POS    No rh immune globuloin      NOT A RH IMMUNE GLOBULIN CANDIDATE, PT RH POSITIVE Performed at [facility], [address] [place], [place] [address]   Urinalysis, Routine w reflex microscopic     Status: Abnormal   Collection Time: 04/16/20  7:26 PM  Result Value Ref Range   Color, Urine YELLOW YELLOW   APPearance CLEAR CLEAR   Specific Gravity, Urine 1.012 1.005 - 1.030   pH 6.0 5.0 - 8.0   Glucose, UA NEGATIVE NEGATIVE mg/dL   Hgb urine dipstick MODERATE (A) NEGATIVE   Bilirubin Urine NEGATIVE NEGATIVE   Ketones, ur NEGATIVE NEGATIVE mg/dL   Protein, ur NEGATIVE NEGATIVE mg/dL   Nitrite NEGATIVE NEGATIVE   Leukocytes,Ua NEGATIVE NEGATIVE   RBC / HPF 6-10 0 - 5 RBC/hpf   WBC, UA 0-5 0 - 5 WBC/hpf   Bacteria, UA NONE SEEN NONE SEEN   Squamous Epithelial / LPF 0-5 0 - 5   Mucus PRESENT     Comment: Performed at [facility], [address] [place], [place] [address]  GC/Chlamydia probe amp ([place])not at [facility]     Status: Abnormal   Collection Time: 04/16/20  7:32 PM  Result Value Ref Range   Neisseria Gonorrhea Negative    Chlamydia Positive (A)    Comment Normal Reference Ranger  Chlamydia - Negative    Comment      Normal Reference Range Neisseria Gonorrhea - Negative  Wet prep, genital     Status: Abnormal   Collection Time: 04/16/20  7:34 PM   Specimen: Vaginal  Result Value Ref Range   Yeast Wet Prep HPF POC NONE SEEN NONE SEEN   Trich, Wet Prep NONE SEEN NONE SEEN   Clue Cells Wet Prep HPF POC NONE SEEN NONE SEEN   WBC, Wet Prep HPF POC MODERATE (A) NONE SEEN   Sperm NONE SEEN     Comment: Performed at [facility], [address] [place], [place] [address]  Beta hCG quant (ref lab)     Status: None   Collection Time: 04/19/20  8:54 AM  Result Value Ref Range   hCG Quant 183 mIU/mL    Comment:                      Female (Non-pregnant)    0 -     5                             (Postmenopausal)  0 -     8  Female (Pregnant)                      Weeks of Gestation                              3                6 -    71                              4               10 -   750                              5              217 -  7138                              6              158 - 29562                              7             [phone]                              8            [phone]                              9            [id number]                             12            Angelica  [phone] [id number]                             18             [phone] Roche E CLIA methodology   Beta hCG quant (ref lab)     Status: None   Collection Time: 04/21/20  8:45 AM  Result Value Ref Range   hCG Quant 229 mIU/mL    Comment:                      Female (Non-pregnant)    0 -     5                             (Postmenopausal)  0 -     8                       Female (Pregnant)                      Weeks of Gestation                              3                6 -    71                              4               10 -   750                              5              217 -  7138                              6              158 - 31795                              7             [phone]                              8            [phone]                              9            [phone]                             10            [phone]                             12  [place] Roche E CLIA methodology   CBC WITH DIFFERENTIAL     Status: None   Collection Time: 04/21/20  1:57 PM  Result Value Ref Range   WBC 5.2 4.0 - 10.5 K/uL   RBC 4.21 3.87 - 5.11 MIL/uL   Hemoglobin 12.5 12.0 - 15.0 g/dL   HCT 39.4 36.0 - 46.0 %   MCV 93.6 80.0 - 100.0 fL   MCH 29.7 26.0 - 34.0 pg   MCHC 31.7 30.0 - 36.0 g/dL   RDW 12.7 11.5 - 15.5 %   Platelets 255 150 - 400 K/uL   nRBC 0.0 0.0 - 0.2 %   Neutrophils Relative % 39 %   Neutro Abs 2.1 1.7 - 7.7 K/uL   Lymphocytes Relative 51 %   Lymphs Abs 2.6 0.7 - 4.0 K/uL   Monocytes Relative 7 %   Monocytes Absolute 0.4 0.1 - 1.0 K/uL   Eosinophils Relative 2 %   Eosinophils Absolute 0.1 0.0 - 0.5 K/uL   Basophils Relative 1 %   Basophils Absolute 0.0 0.0 - 0.1 K/uL   Immature Granulocytes 0 %   Abs Immature Granulocytes 0.01 0.00 - 0.07 K/uL    Comment: Performed at [facility], [address], [place],  [address]  Comprehensive metabolic panel     Status: None   Collection Time: 04/21/20  1:57 PM  Result Value Ref Range   Sodium 137 135 - 145 mmol/L   Potassium 3.9 3.5 - 5.1  mmol/L   Chloride 102 98 - 111 mmol/L   CO2 27 22 - 32 mmol/L   Glucose, Bld 93 70 - 99 mg/dL    Comment: Glucose reference range applies only to samples taken after fasting for at least 8 hours.   BUN 6 6 - 20 mg/dL   Creatinine, Ser 0.95 0.44 - 1.00 mg/dL   Calcium 9.1 8.9 - 10.3 mg/dL   Total Protein 7.2 6.5 - 8.1 g/dL   Albumin 3.9 3.5 - 5.0 g/dL   AST 21 15 - 41 U/L   ALT 20 0 - 44 U/L   Alkaline Phosphatase 50 38 - 126 U/L   Total Bilirubin 0.7 0.3 - 1.2 mg/dL   GFR  calc non Af Amer >60 >60 mL/min   GFR calc Af Amer >60 >60 mL/min   Anion gap 8 5 - 15    Comment: Performed at [facility], [address] [place], [place] [address]  hCG, quantitative, pregnancy     Status: Abnormal   Collection Time: 04/23/20 12:38 PM  Result Value Ref Range   hCG, Beta Chain, Quant, S 315 (H) <5 mIU/mL    Comment:          GEST. AGE      CONC.  (mIU/mL)   <=1 WEEK        5 - 50     2 WEEKS       50 - 500     3 WEEKS       100 - 10,000     4 WEEKS     1,000 - 30,000     5 WEEKS     3,500 - 115,000   6-8 WEEKS     12,000 - 270,000    12 WEEKS     15,000 - 220,000        FEMALE AND NON-PREGNANT FEMALE:     LESS THAN 5 mIU/mL Performed at [facility], [address], [place], [place] [address]     Review of Systems  Gastrointestinal: Negative for abdominal pain, nausea and vomiting.  Genitourinary: Negative for vaginal bleeding.   Physical Exam   Blood pressure 125/73, pulse 72, temperature 99.2 F (37.3 C), temperature source Oral, resp. rate 17, height 5\' 10"  (1.778 m), weight 89.9 kg, last menstrual period 03/06/2020, SpO2 100 %.  Physical Exam  Constitutional: She is oriented to person, place, and time. She appears well-developed and well-nourished. No distress.  HENT:  Head: Normocephalic.  Musculoskeletal:        General: Normal range of motion.  Neurological: She is alert and oriented to person, place, and time.  Skin: Skin is warm. She is not diaphoretic.   Psychiatric: Her behavior is normal.   MAU Course  Procedures  None  MDM  O positive blood type   Day 1: 5/24/2- MAU, MTX given  Day 4: 04/25/20- Message sent to [facility] for labs- patient does not want to go to [facility]  Day 7: 05/02/20- MAU visit   Assessment and Plan    A:  1. Pregnancy of unknown anatomic location   2. Inappropriate change in quantitative hCG in early pregnancy     P:  Discharge home with strict return precautions MTX given today Go to the office Thursday for labs Return to MAU if symptoms worsen   Tequan Redmon, Tuesday, NP 04/25/2020 2:21 PM

## 2020-04-25 NOTE — Progress Notes (Signed)
E-signature pad accepted patient signature but not Charity fundraiser. Will put in a ticket for IT.

## 2020-04-25 NOTE — MAU Note (Signed)
Pt here for methotrexate injection. Denies pain/bleeding.

## 2020-04-28 ENCOUNTER — Other Ambulatory Visit: Payer: Self-pay | Admitting: Obstetrics & Gynecology

## 2020-04-28 ENCOUNTER — Other Ambulatory Visit (INDEPENDENT_AMBULATORY_CARE_PROVIDER_SITE_OTHER): Payer: PRIVATE HEALTH INSURANCE

## 2020-04-28 DIAGNOSIS — O3680X0 Pregnancy with inconclusive fetal viability, not applicable or unspecified: Secondary | ICD-10-CM

## 2020-04-28 LAB — BETA HCG QUANT (REF LAB): hCG Quant: 311 m[IU]/mL

## 2020-04-28 NOTE — Progress Notes (Signed)
Pt here today for stat beta HCG level following methotrexate given on 04/21/20. Pt denies any vaginal bleeding or pain since methotrexate administration. Ectopic precautions given. Lab drawn.   Beta HCG today is 315 which has increased from 229 on 04/21/20. Reviewed with Debroah Loop, MD who states pt should follow up with s/p MTX day 7 beta HCG on Sunday, 05/01/20 at MAU. Called pt. Results and provider recommendation given. Reviewed ectopic precautions. Pt encouraged to call with any concerns.  Fleet Contras RN 04/28/20

## 2020-05-01 ENCOUNTER — Other Ambulatory Visit: Payer: Self-pay

## 2020-05-01 ENCOUNTER — Inpatient Hospital Stay (HOSPITAL_COMMUNITY)
Admission: AD | Admit: 2020-05-01 | Discharge: 2020-05-01 | Discharge status: Against Medical Advice | Payer: PRIVATE HEALTH INSURANCE | Attending: Obstetrics and Gynecology | Admitting: Obstetrics and Gynecology

## 2020-05-01 DIAGNOSIS — O009 Unspecified ectopic pregnancy without intrauterine pregnancy: Secondary | ICD-10-CM | POA: Diagnosis present

## 2020-05-01 DIAGNOSIS — Z3A08 8 weeks gestation of pregnancy: Secondary | ICD-10-CM | POA: Diagnosis not present

## 2020-05-01 LAB — COMPREHENSIVE METABOLIC PANEL
ALT: 27 U/L (ref 0–44)
AST: 21 U/L (ref 15–41)
Albumin: 3.9 g/dL (ref 3.5–5.0)
Alkaline Phosphatase: 52 U/L (ref 38–126)
Anion gap: 9 (ref 5–15)
BUN: 5 mg/dL — ABNORMAL LOW (ref 6–20)
CO2: 26 mmol/L (ref 22–32)
Calcium: 9.3 mg/dL (ref 8.9–10.3)
Chloride: 103 mmol/L (ref 98–111)
Creatinine, Ser: 0.97 mg/dL (ref 0.44–1.00)
GFR calc Af Amer: >60 mL/min (ref >60)
GFR calc non Af Amer: >60 mL/min (ref >60)
Glucose, Bld: 101 mg/dL — ABNORMAL HIGH (ref 70–99)
Potassium: 4.3 mmol/L (ref 3.5–5.1)
Sodium: 138 mmol/L (ref 135–145)
Total Bilirubin: 0.2 mg/dL — ABNORMAL LOW (ref 0.3–1.2)
Total Protein: 7.6 g/dL (ref 6.5–8.1)

## 2020-05-01 LAB — CBC
HCT: 39.7 % (ref 36.0–46.0)
Hemoglobin: 12.8 g/dL (ref 12.0–15.0)
MCH: 29.9 pg (ref 26.0–34.0)
MCHC: 32.2 g/dL (ref 30.0–36.0)
MCV: 92.8 fL (ref 80.0–100.0)
Platelets: 253 10*3/uL (ref 150–400)
RBC: 4.28 MIL/uL (ref 3.87–5.11)
RDW: 12.5 % (ref 11.5–15.5)
WBC: 5.3 10*3/uL (ref 4.0–10.5)
nRBC: 0 % (ref 0.0–0.2)

## 2020-05-01 LAB — HCG, QUANTITATIVE, PREGNANCY: hCG, Beta Chain, Quant, S: 343 m[IU]/mL — ABNORMAL HIGH (ref <5)

## 2020-05-01 NOTE — MAU Note (Signed)
Taylor [name] is a 24 y.o. at [redacted]w[redacted]d here in MAU reporting: here for day 7 labs post MTX. Denies pain and bleeding.  Pain score: 0/10  Vitals:   05/01/20 1010  BP: 127/71  Pulse: 84  Resp: 16  Temp: 99.4 F (37.4 C)  SpO2: 99%     Lab orders placed from triage: hcg

## 2020-05-01 NOTE — MAU Note (Signed)
Attempted to go update patient around 1445, pt and SO not in family room. RN checked lobby and they were not out there either. Informed Donia Ast NP who then tried to contact patient regarding MTX. Assumed pt left the hospital.

## 2020-05-01 NOTE — MAU Provider Note (Signed)
Subjective:  Taylor [name] is a 24 y.o. G1P0 at [redacted]w[redacted]d who presents today for FU BHCG. She was seen on 04/25/2020 and was given MTX. Patient had [place] on 04/21/2020 that showed no IUP with possible intrauterine GS.  Her previous Quantitative Hcg levels are:  5/11: 302 5/15: 278 5/18: 183 5/20: 229 5/22: 315 5/24: Day 1 hCG not performed (MTX given this day) 5/27: Day 4 hCG 311 (verbal report from [facility] as computers were down this day and results have not yet been entered into system) 5/30: Day 7 343  She denies vaginal bleeding. She denies abdominal or pelvic pain.   Objective:  Physical Exam  Nursing note and vitals reviewed.  Patient Vitals for the past 24 hrs:  BP Temp Temp src Pulse Resp SpO2 Height Weight  05/01/20 1010 127/71 99.4 F (37.4 C) Oral 84 16 99 % - -  05/01/20 1008 - - - - - - 5\' 10"  (1.778 m) 90.2 kg   Constitutional: She is oriented to person, place, and time. She appears well-developed and well-nourished. No distress.  HENT:  Head: Normocephalic.  Cardiovascular: Normal rate.  Respiratory: Effort normal.  GI: Soft. There is no tenderness.  Neurological: She is alert and oriented to person, place, and time. Skin: Skin is warm and dry.  Psychiatric: She has a normal mood and affect.   Results for orders placed or performed during the hospital encounter of 05/01/20 (from the past 24 hour(s))  hCG, quantitative, pregnancy     Status: Abnormal   Collection Time: 05/01/20 10:19 AM  Result Value Ref Range   hCG, Beta Chain, Quant, S 343 (H) <5 mIU/mL    Assessment/Plan: Patient being treated as ectopic pregnancy Patient did not have Day 1 hCG s/p MTX HCG did not drop appropriately Consulted with Dr. 05/03/20 who reviewed chart and hCG numbers and confirms patient needs additional MTX dose and repeat CBC and CMP prior to giving Repeat CBC WNL, CMP WNL Went to Family Room to answer additional questions from partner per lab tech report, patient and partner no  longer in [facility] Room and not in waiting room, per [facility], patient and partner left without notifying anyone. Phone call to patient @259PM , no answer, voicemail left requesting patient return to hospital as patient identified self by full name on voicemail greeting. Patient was aware prior to leaving that she would need repeat dose of MTX, only labs were pending prior to MTX. Called patient again @319PM , no answer, no second voicemail left MyChart message sent to patient, briefly discussed life-threatening nature of ectopic pregnancy and encouraged return to MAU as soon as possible Pt left AMA  Nugent, [employer], NP  1:08 PM 05/01/2020

## 2020-05-02 ENCOUNTER — Telehealth: Payer: Self-pay | Admitting: Advanced Practice Midwife

## 2020-05-02 NOTE — Telephone Encounter (Signed)
Patient's boyfriend Silva Bandy called MAU and asked to speak with me. Silva Bandy stated that patient had been returning home from family functions and vomited pink foamy emesis. He is inquiring aboujt treatment for this single episode of emesis.  Patient's chart opened, noted that she was seen in MAU yesterday 05/01/2020 and was advised to get a second dose MTX and patient left AMA. Patient and Silva Bandy strong encouraged to return to MAU for second MTX administration. Reminded them that MAU is always open and can accommodate their schedule needs. Kyrie verbalized understanding.  Clayton Bibles, MSN, CNM Certified Nurse Midwife, [facility] 05/02/20 3:15 PM

## 2020-05-03 ENCOUNTER — Other Ambulatory Visit: Payer: Self-pay

## 2020-05-03 ENCOUNTER — Inpatient Hospital Stay (HOSPITAL_COMMUNITY)
Admission: AD | Admit: 2020-05-03 | Discharge: 2020-05-03 | Disposition: A | Discharge status: Home / Self Care | Payer: PRIVATE HEALTH INSURANCE | Attending: Family Medicine | Admitting: Family Medicine

## 2020-05-03 DIAGNOSIS — O009 Unspecified ectopic pregnancy without intrauterine pregnancy: Secondary | ICD-10-CM | POA: Diagnosis present

## 2020-05-03 DIAGNOSIS — O3680X0 Pregnancy with inconclusive fetal viability, not applicable or unspecified: Secondary | ICD-10-CM | POA: Diagnosis not present

## 2020-05-03 DIAGNOSIS — Z6790 Unspecified blood type, Rh positive: Secondary | ICD-10-CM | POA: Insufficient documentation

## 2020-05-03 DIAGNOSIS — Z3A09 9 weeks gestation of pregnancy: Secondary | ICD-10-CM | POA: Diagnosis not present

## 2020-05-03 LAB — HCG, QUANTITATIVE, PREGNANCY: hCG, Beta Chain, Quant, S: 329 m[IU]/mL — ABNORMAL HIGH (ref <5)

## 2020-05-03 MED ORDER — METHOTREXATE FOR ECTOPIC PREGNANCY
50.0000 mg/m2 | Freq: Once | INTRAMUSCULAR | Status: AC
  Administered 2020-05-03: 105 mg via INTRAMUSCULAR
  Filled 2020-05-03: qty 1

## 2020-05-03 NOTE — MAU Provider Note (Signed)
Subjective:  Taylor [name] is a 24 y.o. G1P0 at [redacted]w[redacted]d who presents today for MTX#2. Patient was seen on 05/01/2020 and needed a repeat dose of MTX d/t increasing Day 4 --> Day 7 hCG, but patient left AMA. She returns today for injection and Day 1 hCG. She denies vaginal bleeding. She denies abdominal or pelvic pain.   Objective:  Physical Exam  Nursing note and vitals reviewed.  Patient Vitals for the past 24 hrs:  BP Temp Temp src Pulse Resp Height Weight  05/03/20 2024 125/70 99.1 F (37.3 C) Oral 77 16 5\' 10"  (1.778 m) 90.1 kg   Constitutional: She is oriented to person, place, and time. She appears well-developed and well-nourished. No distress.  HENT:  Head: Normocephalic.  Cardiovascular: Normal rate.  Respiratory: Effort normal.  GI: Soft. There is no tenderness.  Neurological: She is alert and oriented to person, place, and time. Skin: Skin is warm and dry.  Psychiatric: She has a normal mood and affect.   No results found for this or any previous visit (from the past 24 hour(s)).  Assessment/Plan: Pregnancy of unknown location, being treated as ectopic pregnancy d/t abnormal hCG. The risks of methotrexate were reviewed including failure requiring repeat dosing or eventual surgery. She understands that methotrexate involves frequent return visits to monitor lab values and that she remains at risk of ectopic rupture until her beta is less than assay. ?The patient opts to proceed with methotrexate.  She has no history of hepatic or renal dysfunction, has normal BUN/Cr/LFT's/platelets.  She is felt to be reliable for follow-up. Side effects of photosensitivity & GI upset were discussed.  She knows to avoid direct sunlight and abstain from alcohol, NSAIDs and sexual intercourse for two weeks. She was counseled to discontinue any MVI with folic acid. ?She understands to follow up on D4 (05/06/2020) and D7 (05/09/2020) for repeat BHCG and was given the instruction sheet. ?Strict ectopic  precautions were reviewed, the patient knows to call with any abdominal pain, vomiting, fainting, or any concerns with her health.  Day 0/1 Day 4 Day 7  Sunday Wednesday Saturday  Monday Thursday Sunday  Tuesday Friday Monday  Wednesday Saturday Tuesday  Thursday Sunday Wednesday  Friday Monday Thursday  Saturday Tuesday Friday    Methotrexate Treatment Protocol for Ectopic Pregnancy  [x] Pretreatment testing and instructions  [x] hCG concentration  [x] Transvaginal ultrasound - performed 04/21/2020 [x] Blood group and Rh(D) typing (O Positive) [x] Complete blood count - WNL  [x] Liver and renal function tests  [x] Discontinue folic acid supplements  [x] Counsel patient to avoid NSAIDs, recommend acetaminophen if an analgesic is needed  [x] Advise patient to refrain from sexual intercourse and strenuous exercise  Treatment day  Single dose protocol   1  hCG.  Administer Methotrexate 50 mg/m2 body surface area IM  4  hCG  7  hCG  If <15 percent hCG decline from day 4 to 7, give additional dose of methotrexate 50 mg/m2 IM  If ?15 percent hCG decline from day 4 to 7, draw hCG weekly until undetectable  14  hCG  If <15 percent hCG decline from day 7 to 14, give additional dose of methotrexate 50 mg/m2 IM  If ?15 percent hCG decline from day 7 to 14, check hCG weekly until undetectable  21 and 28  If 3 doses have been given and there is a <15 percent hCG decline from day 21 to 28, proceed with laparoscopic surgery  Laparoscopy  If severe abdominal pain or an acute abdomen suggestive of  tubal rupture occurs If ultrasonography reveals greater than 300 mL pelvic or other intraperitoneal fluid  The hCG concentration usually declines to less than 15 mIU/mL by 35 days postinjection but may take as long as 109 days. If the hCG does not decline to zero, a new pregnancy should be excluded; if the hCG is rising, a transvaginal ultrasound should be performed. Alternatively, some patients have a slow  clearance of serum hCG. If three weekly values are similar, consider an additional dose of MTX (50 mg/m2) not to exceed the recommended maximum of three total doses. This typically accelerates the decline of serum hCG. The risk of gestational trophoblastic disease is low. Folinic acid rescue is not required for women treated with the single-dose protocol, even if multiple doses are ultimately given.   Prepared with data from:   [facility]. Clinical practice. Ectopic pregnancy. [place] Med 2009; 361:379  American College of Obstetricians and Gynecologists. ACOG Practice Bulletin No. 94: Medical management of ectopic pregnancy. Obstet Gynecol 2008; 935:7017.  Appts scheduled for f/u hCG at [facility] Pt discharged to home in stable condition  Cliffard Hair, Gerrie Nordmann, NP  9:20 PM 05/03/2020

## 2020-05-03 NOTE — MAU Note (Signed)
Pt here for 2nd dose of MTX. Pt denies pain or vaginal bleeding.

## 2020-05-03 NOTE — Discharge Instructions (Signed)
Ectopic Pregnancy ° °An ectopic pregnancy is when the fertilized egg attaches (implants) outside the uterus. Most ectopic pregnancies occur in one of the tubes where eggs travel from the ovary to the uterus (fallopian tubes), but the implanting can occur in other locations. In rare cases, ectopic pregnancies occur on the ovary, intestine, pelvis, abdomen, or cervix. In an ectopic pregnancy, the fertilized egg does not have the ability to develop into a normal, healthy baby. °A ruptured ectopic pregnancy is one in which tearing or bursting of a fallopian tube causes internal bleeding. Often, there is intense lower abdominal pain, and vaginal bleeding sometimes occurs. Having an ectopic pregnancy can be life-threatening. If this dangerous condition is not treated, it can lead to blood loss, shock, or even death. °What are the causes? °The most common cause of this condition is damage to one of the fallopian tubes. A fallopian tube may be narrowed or blocked, and that keeps the fertilized egg from reaching the uterus. °What increases the risk? °This condition is more likely to develop in women of childbearing age who have different levels of risk. The levels of risk can be divided into three categories. °High risk °· You have gone through infertility treatment. °· You have had an ectopic pregnancy before. °· You have had surgery on the fallopian tubes, or another surgical procedure, such as an abortion. °· You have had surgery to have the fallopian tubes tied (tubal ligation). °· You have problems or diseases of the fallopian tubes. °· You have been exposed to diethylstilbestrol (DES). This medicine was used until 1971, and it had effects on babies whose mothers took the medicine. °· You become pregnant while using an IUD (intrauterine device) for birth control. °Moderate risk °· You have a history of infertility. °· You have had an STI (sexually transmitted infection). °· You have a history of pelvic inflammatory  disease (PID). °· You have scarring from endometriosis. °· You have multiple sexual partners. °· You smoke. °Low risk °· You have had pelvic surgery. °· You use vaginal douches. °· You became sexually active before age 18. °What are the signs or symptoms? °Common symptoms of this condition include normal pregnancy symptoms, such as missing a period, nausea, tiredness, abdominal pain, breast tenderness, and bleeding. However, ectopic pregnancy will have additional symptoms, such as: °· Pain with intercourse. °· Irregular vaginal bleeding or spotting. °· Cramping or pain on one side or in the lower abdomen. °· Fast heartbeat, low blood pressure, and sweating. °· Passing out while having a bowel movement. °Symptoms of a ruptured ectopic pregnancy and internal bleeding may include: °· Sudden, severe pain in the abdomen and pelvis. °· Dizziness, weakness, light-headedness, or fainting. °· Pain in the shoulder or neck area. °How is this diagnosed? °This condition is diagnosed by: °· A pelvic exam to locate pain or a mass in the abdomen. °· A pregnancy test. This blood test checks for the presence as well as the specific level of pregnancy hormone in the bloodstream. °· Ultrasound. This is performed if a pregnancy test is positive. In this test, a probe is inserted into the vagina. The probe will detect a fetus, possibly in a location other than the uterus. °· Taking a sample of uterus tissue (dilation and curettage, or D&C). °· Surgery to perform a visual exam of the inside of the abdomen using a thin, lighted tube that has a tiny camera on the end (laparoscope). °· Culdocentesis. This procedure involves inserting a needle at the top of   the vagina, behind the uterus. If blood is present in this area, it may indicate that a fallopian tube is torn. How is this treated? This condition is treated with medicine or surgery. Medicine  An injection of a medicine (methotrexate) may be given to cause the pregnancy tissue to be  absorbed. This medicine may save your fallopian tube. It may be given if: ? The diagnosis is made early, with no signs of active bleeding. ? The fallopian tube has not ruptured. ? You are considered to be a good candidate for the medicine. Usually, pregnancy hormone blood levels are checked after methotrexate treatment. This is to be sure that the medicine is effective. It may take 4-6 weeks for the pregnancy to be absorbed. Most pregnancies will be absorbed by 3 weeks. Surgery  A laparoscope may be used to remove the pregnancy tissue.  If severe internal bleeding occurs, a larger cut (incision) may be made in the lower abdomen (laparotomy) to remove the fetus and placenta. This is done to stop the bleeding.  Part or all of the fallopian tube may be removed (salpingectomy) along with the fetus and placenta. The fallopian tube may also be repaired during the surgery.  In very rare circumstances, removal of the uterus (hysterectomy) may be required.  After surgery, pregnancy hormone testing may be done to be sure that there is no pregnancy tissue left. Whether your treatment is medicine or surgery, you may receive a Rho (D) immune globulin shot to prevent problems with any future pregnancy. This shot may be given if:  You are Rh-negative and the baby's father is Rh-positive.  You are Rh-negative and you do not know the Rh type of the baby's father. Follow these instructions at home:  Rest and limit your activity after the procedure for as long as told by your health care provider.  Until your health care provider says that it is safe: ? Do not lift anything that is heavier than 10 lb (4.5 kg), or the limit that your health care provider tells you. ? Avoid physical exercise and any movement that requires effort (is strenuous).  To help prevent constipation: ? Eat a healthy diet that includes fruits, vegetables, and whole grains. ? Drink 6-8 glasses of water per day. Get help right away  if:  You develop worsening pain that is not relieved by medicine.  You have: ? A fever or chills. ? Vaginal bleeding. ? Redness and swelling at the incision site. ? Nausea and vomiting.  You feel dizzy or weak.  You feel light-headed or you faint. This information is not intended to replace advice given to you by your health care provider. Make sure you discuss any questions you have with your health care provider. Document Revised: 11/01/2017 Document Reviewed: 06/20/2016 Elsevier Patient Education  2020 [employer] Inc[employer]         Methotrexate Treatment for an Ectopic Pregnancy  Methotrexate is a medicine that treats an ectopic pregnancy. An ectopic pregnancy is a pregnancy in which the fetus develops outside the uterus. This kind of pregnancy can be dangerous. Methotrexate works by stopping the growth of the fertilized egg. It also helps your body absorb tissue from the egg. This takes between 2-6 weeks. Most ectopic pregnancies can be successfully treated with methotrexate if they are diagnosed early. Tell a health care provider about:  Any allergies you have.  All medicines you are taking, including vitamins, herbs, eye drops, creams, and over-the-counter medicines.  Any medical conditions you have. What  are the risks? Generally, this is a safe treatment. However, problems may occur, including:  Nausea or vomiting or both.  Vaginal bleeding or spotting.  Diarrhea.  Abdominal cramping.  Dizziness or feeling lightheaded.  Mouth sores.  Swelling or irritation of the lining of your lungs (pneumonitis).  Liver damage.  Hair loss. There is a risk that methotrexate treatment will fail and your pregnancy will continue. There is also a risk that the ectopic pregnancy might rupture while you are using this medicine. What happens before the procedure?  Liver tests, kidney tests, and a complete blood test will be done.  Blood tests will be done to measure the pregnancy  hormone levels and to determine your blood type.  If you are Rh-negative and the father is Rh-positive or his Rh type is not known, you will be given a Rho (D) immune globulin shot. What happens during the procedure? Your health care provider may give you methotrexate by injection or in the form of a pill. Methotrexate may be given as a single dose of medicine or a series of doses, depending on your response to the treatment.  Methotrexate injections will be given by your health care provider. This is the most common way that methotrexate is used to treat an ectopic pregnancy.  If you are prescribed oral methotrexate, it is very important that you follow your health care provider's instructions on how to take oral methotrexate. Additional medicines may be needed to manage an ectopic pregnancy. The procedure may vary among health care providers and hospitals. What happens after the procedure?  You may have abdominal cramping, vaginal bleeding, and fatigue.  Blood tests will be taken at timed intervals for several days or weeks to check your pregnancy hormone levels. The blood tests will be done until the pregnancy hormone can no longer be detected in the blood.  You may need to have a surgical procedure to remove the ectopic pregnancy if methotrexate treatment fails.  Follow instructions from your health care provider on how and when to report any symptoms that may indicate a ruptured ectopic pregnancy. Summary  Methotrexate is a medicine that treats an ectopic pregnancy.  Methotrexate may be given in a single dose or a series of doses over time.  Blood tests will be taken at timed intervals for several days or weeks to check your pregnancy hormone levels. The blood tests will be done until no more pregnancy hormone is detected in the blood.  There is a risk that methotrexate treatment will fail and your pregnancy will continue. There is also a risk that the ectopic pregnancy might rupture  while you are using this medicine. This information is not intended to replace advice given to you by your health care provider. Make sure you discuss any questions you have with your health care provider. Document Revised: 11/01/2017 Document Reviewed: 01/08/2017 Elsevier Patient Education  2020 [employer] Inc.         Methotrexate Treatment for an Ectopic Pregnancy, Care After This sheet gives you information about how to care for yourself after your procedure. Your health care provider may also give you more specific instructions. If you have problems or questions, contact your health care provider. What can I expect after the procedure? After the procedure, it is common to have:  Abdominal cramping.  Vaginal bleeding.  Fatigue.  Nausea.  Vomiting.  Diarrhea. Blood tests will be taken at timed intervals for several days or weeks to check your pregnancy hormone levels. The blood tests  will be done until the pregnancy hormone can no longer be detected in the blood. Follow these instructions at home: Activity  Do not have sex until your health care provider approves.  Limit activities that take a lot of effort as told by your health care provider. Medicines  Take over the counter and prescription medicines only as told by your health care provider.  Do not take aspirin, ibuprofen, naproxen, or any other NSAIDs.  Do not take folic acid, prenatal vitamins, or other vitamins that contain folic acid. General instructions   Do not drink alcohol.  Follow instructions from your health care provider on how and when to report any symptoms that may indicate a ruptured ectopic pregnancy.  Keep all follow-up visits as told by your health care provider. This is important. Contact a health care provider if:  You have persistent nausea and vomiting.  You have persistent diarrhea.  You are having a reaction to the medicine, such as: ? Tiredness. ? Skin rash. ? Hair  loss. Get help right away if:  Your abdominal or pelvic pain gets worse.  You have more vaginal bleeding.  You feel light-headed or you faint.  You have shortness of breath.  Your heart rate increases.  You develop a cough.  You have chills.  You have a fever. Summary  After the procedure, it is common to have symptoms of abdominal cramping, vaginal bleeding and fatigue. You may also experience other symptoms.  Blood tests will be taken at timed intervals for several days or weeks to check your pregnancy hormone levels. The blood tests will be done until the pregnancy hormone can no longer be detected in the blood.  Limit strenuous activity as told by your health care provider.  Follow instructions from your health care provider on how and when to report any symptoms that may indicate a ruptured ectopic pregnancy. This information is not intended to replace advice given to you by your health care provider. Make sure you discuss any questions you have with your health care provider. Document Revised: 11/01/2017 Document Reviewed: 01/08/2017 Elsevier Patient Education  2020 Else[employer] Inc.         Ruptured Ectopic Pregnancy  An ectopic pregnancy is when a fertilized egg attaches (implants) outside of the uterus, usually in a fallopian tube. A ruptured ectopic pregnancy is when the fallopian tube tears or bursts. This results in internal bleeding, intense abdominal pain, and sometimes, vaginal bleeding. Most ectopic pregnancies occur in the fallopian tube. In rare cases, it may occur on the ovary, intestine, pelvis, or cervix. An ectopic pregnancy does not have the ability to develop into a normal, healthy baby. A ruptured ectopic pregnancy can affect your ability to have children (fertility), depending on damage it causes to your reproductive organs. Ruptured ectopic pregnancy is a medical emergency. If not treated immediately, it can lead to blood loss, shock, or even  death. What are the causes? Most ectopic pregnancies are caused by damage to the fallopian tubes. The damage prevents the fertilized egg from implanting in the uterus. In some cases, the cause may not be known. What increases the risk? You are at increased risk for an ectopic pregnancy if:  You have had a previous ectopic pregnancy.  You have had previous fallopian tube surgery.  You have had previous surgery to have the fallopian tubes tied (tubal ligation).  You have had infertility treatments or have a history of infertility.  You have been exposed to DES. DES is a medicine   that was used until 1971 and had effects on babies whose mothers took the medicine.  You use an IUD (intrauterine device) for birth control.  You use progestin-only oral contraception for birth control.  You have a history of pelvic inflammatory disease (PID).  You have a history of endometriosis.  You smoke.  You became sexually active before 23 years of age.  You have multiple sexual partners. What are the signs or symptoms? Symptoms of a ruptured ectopic pregnancy and internal bleeding may include:  Sudden, severe pain in the abdomen and pelvis.  Dizziness or fainting.  Pain in the shoulder area.  Vaginal bleeding. How is this diagnosed? This condition is diagnosed based on your medical history, symptoms, a physical exam, and tests, which may include:  A pregnancy test.  An ultrasound.  Measuring the levels of the pregnancy hormone in the bloodstream.  Taking a sample of tissue from the uterus (dilation and curettage, D&C).  Surgery to visually examine the inside of the abdomen using a lighted tube (laparoscopy). How is this treated? This condition is treated with IV fluids and emergency surgery to remove the ectopic pregnancy and repair the area where the rupture occured. If you have lost a lot of blood, you may need a blood transfusion. If you are Rh negative and your baby's father is  Rh positive, or the Rh type of the father is unknown, you may receive a Rho (D) immune globulin shot. This is to prevent Rh problems in future pregnancies. Additional medicines may be given. Get help right away if:  You are taking medicines to treat an ectopic pregnancy and you develop symptoms of a rupture. These include: ? Fever or chills. ? Shoulder pain. ? Vaginal bleeding. ? Nausea and vomiting. ? Severe abdominal pain or cramping. ? Feeling light-headed or fainting. Summary  An ectopic pregnancy is when a fertilized egg attaches (implants) outside of the uterus, usually in a fallopian tube. A ruptured ectopic pregnancy is when the fallopian tube tears or bursts.  Ruptured ectopic pregnancy is a medical emergency. If not treated immediately, it can lead to blood loss, shock, or even death.  This condition is treated with IV fluids and emergency surgery to remove the ectopic pregnancy and repair the area where the rupture occured. If you have lost a lot of blood, you may need a blood transfusion. This information is not intended to replace advice given to you by your health care provider. Make sure you discuss any questions you have with your health care provider. Document Revised: 11/01/2017 Document Reviewed: 02/06/2017 Elsevier Patient Education  [place].

## 2020-05-06 ENCOUNTER — Other Ambulatory Visit: Payer: PRIVATE HEALTH INSURANCE

## 2020-05-06 ENCOUNTER — Telehealth: Payer: Self-pay | Admitting: *Deleted

## 2020-05-06 NOTE — Telephone Encounter (Signed)
Taylor [name] missed her Stat BHCG for today.  I called her and she states she couldn't come because she had to work. She denies having pain or any issues. I advised her to go to [facility] today when she can for another stat bhcg since we are closed.  I informed her I see she has another lab appt 6/7. She voices understanding. Tishanna Dunford,RN

## 2020-05-09 ENCOUNTER — Other Ambulatory Visit: Payer: PRIVATE HEALTH INSURANCE

## 2020-05-11 ENCOUNTER — Other Ambulatory Visit: Payer: Self-pay

## 2020-05-11 ENCOUNTER — Inpatient Hospital Stay (HOSPITAL_COMMUNITY)
Admission: AD | Admit: 2020-05-11 | Discharge: 2020-05-11 | Disposition: A | Discharge status: Home / Self Care | Payer: PRIVATE HEALTH INSURANCE | Attending: Obstetrics and Gynecology | Admitting: Obstetrics and Gynecology

## 2020-05-11 DIAGNOSIS — Z3A09 9 weeks gestation of pregnancy: Secondary | ICD-10-CM | POA: Insufficient documentation

## 2020-05-11 DIAGNOSIS — Z3A01 Less than 8 weeks gestation of pregnancy: Secondary | ICD-10-CM

## 2020-05-11 DIAGNOSIS — O3680X0 Pregnancy with inconclusive fetal viability, not applicable or unspecified: Secondary | ICD-10-CM

## 2020-05-11 DIAGNOSIS — O009 Unspecified ectopic pregnancy without intrauterine pregnancy: Secondary | ICD-10-CM

## 2020-05-11 LAB — CBC
HCT: 37.1 % (ref 36.0–46.0)
Hemoglobin: 12.1 g/dL (ref 12.0–15.0)
MCH: 30.3 pg (ref 26.0–34.0)
MCHC: 32.6 g/dL (ref 30.0–36.0)
MCV: 92.8 fL (ref 80.0–100.0)
Platelets: 195 10*3/uL (ref 150–400)
RBC: 4 MIL/uL (ref 3.87–5.11)
RDW: 13 % (ref 11.5–15.5)
WBC: 5.1 10*3/uL (ref 4.0–10.5)
nRBC: 0 % (ref 0.0–0.2)

## 2020-05-11 LAB — HCG, QUANTITATIVE, PREGNANCY: hCG, Beta Chain, Quant, S: 34 m[IU]/mL — ABNORMAL HIGH (ref <5)

## 2020-05-11 NOTE — [facility] Provider Note (Signed)
Chief Complaint: Follow-up   First Provider Initiated Contact with Patient 05/11/20 2103        SUBJECTIVE HPI: Taylor [name] is a 24 y.o. G1P0 at [redacted]w[redacted]d by LMP who presents to maternity admissions reporting need to have HCG drawn for followup of MTX #2 dose.  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  Did not respond to first Methotrexate so got a second dose on 6/1//21 She denies vaginal bleeding, vaginal itching/burning, urinary symptoms, h/a, dizziness, n/v, or fever/chills.     HPI RN Note: Here for repeat BHCG following #2 MTX 1 wk ago today. Denies any pain. States used BR today and looked like a small brown sac came out. No further bleeding. No other concerns. Labs drawn in Triage. Explained she is to wait in lobby for lab results which will be about an hour and half. Will then come back to Triage and provider will discuss results and POC. Pt voices understanding   No past medical history on file. No past surgical history on file. Social History   Socioeconomic History  . Marital status: Single    Spouse name: Not on file  . Number of children: Not on file  . Years of education: Not on file  . Highest education level: Not on file  Occupational History  . Not on file  Tobacco Use  . Smoking status: Never Smoker  . Smokeless tobacco: Never Used  Substance and Sexual Activity  . Alcohol use: Never  . Drug use: Never  . Sexual activity: Not on file  Other Topics Concern  . Not on file  Social History Narrative  . Not on file   Social Determinants of Health   Financial Resource Strain:   . Difficulty of Paying Living Expenses:   Food Insecurity:   . Worried About Charity fundraiser in the Last Year:   . Arboriculturist in the Last Year:   Transportation Needs:   . Film/video editor (Medical):   Marland Kitchen Lack of Transportation (Non-Medical):   Physical Activity:   . Days of Exercise per Week:   . Minutes of Exercise per Session:   Stress:   .  Feeling of Stress :   Social Connections:   . Frequency of Communication with Friends and Family:   . Frequency of Social Gatherings with Friends and Family:   . Attends Religious Services:   . Active Member of Clubs or Organizations:   . Attends Archivist Meetings:   Marland Kitchen Marital Status:   Intimate Partner Violence:   . Fear of Current or Ex-Partner:   . Emotionally Abused:   Marland Kitchen Physically Abused:   . Sexually Abused:    No current facility-administered medications on file prior to encounter.   No current outpatient medications on file prior to encounter.   No Known Allergies  I have reviewed patient's Past Medical Hx, Surgical Hx, Family Hx, Social Hx, medications and allergies.   ROS:  Review of Systems  Constitutional: Negative for appetite change, chills, fatigue and fever.  Respiratory: Negative for shortness of breath.   Cardiovascular: Negative for leg swelling.  Gastrointestinal: Negative for abdominal pain, constipation, diarrhea and nausea.  Genitourinary: Negative for pelvic pain and vaginal bleeding.  Musculoskeletal: Negative for back pain.   Review of Systems  Other systems negative   Physical Exam  Physical Exam Patient Vitals for the past 24 hrs:  BP Temp Pulse Resp Height Weight  05/11/20 2049 120/66 99.1  F (37.3 C) 79 18 5\' 10"  (1.778 m) 92.1 kg   Constitutional: Well-developed, well-nourished female in no acute distress.  Cardiovascular: normal rate Respiratory: normal effort GI: Abd soft, non-tender. Pos BS x 4 MS: Extremities nontender, no edema, normal ROM Neurologic: Alert and oriented x 4.  GU: Neg CVAT.  PELVIC EXAM: deferred  LAB RESULTS Results for orders placed or performed during the hospital encounter of 05/11/20 (from the past 24 hour(s))  hCG, quantitative, pregnancy     Status: Abnormal   Collection Time: 05/11/20  8:52 PM  Result Value Ref Range   hCG, Beta Chain, Quant, S 34 (H) <5 mIU/mL  CBC     Status: None    Collection Time: 05/11/20  8:52 PM  Result Value Ref Range   WBC 5.1 4.0 - 10.5 K/uL   RBC 4.00 3.87 - 5.11 MIL/uL   Hemoglobin 12.1 12.0 - 15.0 g/dL   HCT 07/11/20 78.2 - 95.6 %   MCV 92.8 80.0 - 100.0 fL   MCH 30.3 26.0 - 34.0 pg   MCHC 32.6 30.0 - 36.0 g/dL   RDW 21.3 08.6 - 57.8 %   Platelets 195 150 - 400 K/uL   nRBC 0.0 0.0 - 0.2 %    Ref. Range 05/01/2020 10:19 05/01/2020 13:51 05/03/2020 20:35 05/11/2020 20:52  HCG, Beta Chain, Quant, S Latest Ref Range: <5 mIU/mL 343 (H)  329 (H) 34 (H)                                                     Day#7 (after Dose#1)              MTX#2               Day#8  --/--/O POS (05/15 1917)  IMAGING   [facility] Management/MDM: Ordered repeat Quant HCG This level has dropped nicely Discussed we need to follow to "0" Recommend repeat 4-7 days, message sent to office  ASSESSMENT Pregnancy of unknown location Presumed ectopic pregnancy Appropriate drop in HCG post second dose of methotrexate  PLAN Discharge home Plan to repeat HCG level in 4-7 days in clinic  Ectopic precautions  Pt stable at time of discharge. Encouraged to return here or to other Urgent Care/ED if she develops worsening of symptoms, increase in pain, fever, or other concerning symptoms.    05-20-2003 CNM, MSN Certified Nurse-Midwife 05/11/2020  9:03 PM

## 2020-05-11 NOTE — [facility] Note (Addendum)
Here for repeat BHCG following #2 MTX 1 wk ago today. Denies any pain. States used BR today and looked like a small brown sac came out. No further bleeding. No other concerns. Labs drawn in Triage. Explained she is to wait in lobby for lab results which will be about an hour and half. Will then come back to Triage and provider will discuss results and POC. Pt voices understanding

## 2020-05-11 NOTE — Discharge Instructions (Signed)
Methotrexate Treatment for an Ectopic Pregnancy  Methotrexate is a medicine that treats an ectopic pregnancy. An ectopic pregnancy is a pregnancy in which the fetus develops outside the uterus. This kind of pregnancy can be dangerous. Methotrexate works by stopping the growth of the fertilized egg. It also helps your body absorb tissue from the egg. This takes between 2-6 weeks. Most ectopic pregnancies can be successfully treated with methotrexate if they are diagnosed early. Tell a health care provider about:  Any allergies you have.  All medicines you are taking, including vitamins, herbs, eye drops, creams, and over-the-counter medicines.  Any medical conditions you have. What are the risks? Generally, this is a safe treatment. However, problems may occur, including:  Nausea or vomiting or both.  Vaginal bleeding or spotting.  Diarrhea.  Abdominal cramping.  Dizziness or feeling lightheaded.  Mouth sores.  Swelling or irritation of the lining of your lungs (pneumonitis).  Liver damage.  Hair loss. There is a risk that methotrexate treatment will fail and your pregnancy will continue. There is also a risk that the ectopic pregnancy might rupture while you are using this medicine. What happens before the procedure?  Liver tests, kidney tests, and a complete blood test will be done.  Blood tests will be done to measure the pregnancy hormone levels and to determine your blood type.  If you are Rh-negative and the father is Rh-positive or his Rh type is not known, you will be given a Rho (D) immune globulin shot. What happens during the procedure? Your health care provider may give you methotrexate by injection or in the form of a pill. Methotrexate may be given as a single dose of medicine or a series of doses, depending on your response to the treatment.  Methotrexate injections will be given by your health care provider. This is the most common way that methotrexate is used  to treat an ectopic pregnancy.  If you are prescribed oral methotrexate, it is very important that you follow your health care provider's instructions on how to take oral methotrexate. Additional medicines may be needed to manage an ectopic pregnancy. The procedure may vary among health care providers and hospitals. What happens after the procedure?  You may have abdominal cramping, vaginal bleeding, and fatigue.  Blood tests will be taken at timed intervals for several days or weeks to check your pregnancy hormone levels. The blood tests will be done until the pregnancy hormone can no longer be detected in the blood.  You may need to have a surgical procedure to remove the ectopic pregnancy if methotrexate treatment fails.  Follow instructions from your health care provider on how and when to report any symptoms that may indicate a ruptured ectopic pregnancy. Summary  Methotrexate is a medicine that treats an ectopic pregnancy.  Methotrexate may be given in a single dose or a series of doses over time.  Blood tests will be taken at timed intervals for several days or weeks to check your pregnancy hormone levels. The blood tests will be done until no more pregnancy hormone is detected in the blood.  There is a risk that methotrexate treatment will fail and your pregnancy will continue. There is also a risk that the ectopic pregnancy might rupture while you are using this medicine. This information is not intended to replace advice given to you by your health care provider. Make sure you discuss any questions you have with your health care provider. Document Revised: 11/01/2017 Document Reviewed: 01/08/2017 Elsevier Patient   Education  2020 Elsevier Inc.  

## 2020-05-11 NOTE — Progress Notes (Addendum)
Wynelle Bourgeois CNM in to see pt and discuss lab results and d/c plan. Written and verbal d/c instructions given and understanding voiced. Pt will [facility] on [address]

## 2020-05-16 ENCOUNTER — Other Ambulatory Visit: Payer: Self-pay

## 2020-05-16 ENCOUNTER — Other Ambulatory Visit: Payer: PRIVATE HEALTH INSURANCE

## 2020-05-16 ENCOUNTER — Other Ambulatory Visit: Payer: Self-pay | Admitting: *Deleted

## 2020-05-16 DIAGNOSIS — O009 Unspecified ectopic pregnancy without intrauterine pregnancy: Secondary | ICD-10-CM

## 2020-05-17 ENCOUNTER — Telehealth: Payer: Self-pay

## 2020-05-17 LAB — BETA HCG QUANT (REF LAB): hCG Quant: 2 m[IU]/mL

## 2020-05-17 NOTE — Telephone Encounter (Signed)
Pt called requesting results for her beta hcg.  Notified pt that her results are a two.  Pt verbalized understanding with no further questions.   Addison Naegeli, RN

## 2020-07-06 ENCOUNTER — Other Ambulatory Visit: Payer: Self-pay

## 2020-07-06 ENCOUNTER — Encounter (HOSPITAL_COMMUNITY): Payer: Self-pay | Admitting: Emergency Medicine

## 2020-07-06 ENCOUNTER — Inpatient Hospital Stay (HOSPITAL_COMMUNITY)
Admission: EM | Admit: 2020-07-06 | Discharge: 2020-07-06 | Disposition: A | Discharge status: Home / Self Care | Payer: Medicaid Other | Attending: Family Medicine | Admitting: Family Medicine

## 2020-07-06 DIAGNOSIS — H938X9 Other specified disorders of ear, unspecified ear: Secondary | ICD-10-CM | POA: Insufficient documentation

## 2020-07-06 DIAGNOSIS — Z9229 Personal history of other drug therapy: Secondary | ICD-10-CM | POA: Insufficient documentation

## 2020-07-06 LAB — URINALYSIS, ROUTINE W REFLEX MICROSCOPIC
Bilirubin Urine: NEGATIVE
Glucose, UA: NEGATIVE mg/dL
Hgb urine dipstick: NEGATIVE
Ketones, ur: NEGATIVE mg/dL
Leukocytes,Ua: NEGATIVE
Nitrite: NEGATIVE
Protein, ur: NEGATIVE mg/dL
Specific Gravity, Urine: 1.016 (ref 1.005–1.030)
pH: 8 (ref 5.0–8.0)

## 2020-07-06 NOTE — ED Triage Notes (Signed)
Pt reports decreased hearing and pain to L ear for the past 2-3 days. Denies any other symptoms.

## 2020-07-06 NOTE — MAU Provider Note (Signed)
Patient Taylor [name] is a 24 y.o. G1P0 Non-pregnant female here with no complaints but wants a pregnancy test. She has a history of ectopic (had methotrexate 3 months ago) and she is supposed to get her period in two days. Has not taken a test yet. She also feels " a popping" in her ear. Denies any other complaints.   Explained to patient that we do not do pregnancy tests here upon patient request; if she doesn't get a period in the next few days, she can take a pregnancy test in 10 days. Recommend she go to urgent care if she is concerned about ear pain or other complaints.    Charlesetta Garibaldi Abbygayle Helfand 07/06/2020, 2:27 PM

## 2020-07-06 NOTE — MAU Note (Signed)
Pt has no confirmed pregnancy and no pregnancy-related complaints. Was sent to urgent care for ear issues. Provider did MSE.

## 2020-09-22 LAB — OB RESULTS CONSOLE HIV ANTIBODY (ROUTINE TESTING): HIV: NONREACTIVE

## 2020-09-22 LAB — OB RESULTS CONSOLE PLATELET COUNT: Platelets: 262

## 2020-09-22 LAB — OB RESULTS CONSOLE VARICELLA ZOSTER ANTIBODY, IGG: Varicella: IMMUNE

## 2020-09-22 LAB — OB RESULTS CONSOLE RUBELLA ANTIBODY, IGM: Rubella: IMMUNE

## 2020-09-22 LAB — OB RESULTS CONSOLE ANTIBODY SCREEN: Antibody Screen: NEGATIVE

## 2020-09-22 LAB — OB RESULTS CONSOLE HEPATITIS B SURFACE ANTIGEN: Hepatitis B Surface Ag: NEGATIVE

## 2020-09-22 LAB — OB RESULTS CONSOLE RPR: RPR: NONREACTIVE

## 2020-09-22 LAB — OB RESULTS CONSOLE HGB/HCT, BLOOD
HCT: 38 (ref 29–41)
Hemoglobin: 12.3

## 2020-09-22 LAB — OB RESULTS CONSOLE ABO/RH: RH Type: POSITIVE

## 2020-09-22 LAB — HEPATITIS C ANTIBODY: HCV Ab: NEGATIVE

## 2020-09-23 LAB — SICKLE CELL SCREEN: Sickle Cell Screen: NEGATIVE

## 2020-09-28 LAB — OB RESULTS CONSOLE GC/CHLAMYDIA
Chlamydia: NEGATIVE
Gonorrhea: NEGATIVE

## 2020-09-29 LAB — CYSTIC FIBROSIS DIAGNOSTIC STUDY: Interpretation-CFDNA:: NEGATIVE

## 2021-02-22 ENCOUNTER — Telehealth: Payer: PRIVATE HEALTH INSURANCE | Admitting: Family

## 2021-02-22 DIAGNOSIS — K0889 Other specified disorders of teeth and supporting structures: Secondary | ICD-10-CM

## 2021-02-22 MED ORDER — AMOXICILLIN 500 MG PO CAPS: 500.0000 mg | ORAL_CAPSULE | Freq: Three times a day (TID) | ORAL | 0 refills | Status: DC

## 2021-02-22 NOTE — Progress Notes (Signed)
E-Visit for Dental Pain  We are sorry that you are not feeling well.  Here is how we plan to help!  Based on what you have shared with me in the questionnaire, it sounds like you have broken tooth.   Amoxicillin 500mg  3 times per day for 10 days.  It is imperative that you see a dentist within 10 days of this eVisit to determine the cause of the dental pain and be sure it is adequately treated  A toothache or tooth pain is caused when the nerve in the root of a tooth or surrounding a tooth is irritated. Dental (tooth) infection, decay, injury, or loss of a tooth are the most common causes of dental pain. Pain may also occur after an extraction (tooth is pulled out). Pain sometimes originates from other areas and radiates to the jaw, thus appearing to be tooth pain.Bacteria growing inside your mouth can contribute to gum disease and dental decay, both of which can cause pain. A toothache occurs from inflammation of the central portion of the tooth called pulp. The pulp contains nerve endings that are very sensitive to pain. Inflammation to the pulp or pulpitis may be caused by dental cavities, trauma, and infection.    HOME CARE:   For toothaches: . Over-the-counter pain medications such as acetaminophen or ibuprofen may be used. Take these as directed on the package while you arrange for a dental appointment. . Avoid very cold or hot foods, because they may make the pain worse. . You may get relief from biting on a cotton ball soaked in oil of cloves. You can get oil of cloves at most drug stores.  For jaw pain: .  Aspirin may be helpful for problems in the joint of the jaw in adults. . If pain happens every time you open your mouth widely, the temporomandibular joint (TMJ) may be the source of the pain. Yawning or taking a large bite of food may worsen the pain. An appointment with your doctor or dentist will help you find the cause.     GET HELP RIGHT AWAY IF:  . You have a high fever  or chills . If you have had a recent head or face injury and develop headache, light headedness, nausea, vomiting, or other symptoms that concern you after an injury to your face or mouth, you could have a more serious injury in addition to your dental injury. . A facial rash associated with a toothache: This condition may improve with medication. Contact your doctor for them to decide what is appropriate. . Any jaw pain occurring with chest pain: Although jaw pain is most commonly caused by dental disease, it is sometimes referred pain from other areas. People with heart disease, especially people who have had stents placed, people with diabetes, or those who have had heart surgery may have jaw pain as a symptom of heart attack or angina. If your jaw or tooth pain is associated with lightheadedness, sweating, or shortness of breath, you should see a doctor as soon as possible. . Trouble swallowing or excessive pain or bleeding from gums: If you have a history of a weakened immune system, diabetes, or steroid use, you may be more susceptible to infections. Infections can often be more severe and extensive or caused by unusual organisms. Dental and gum infections in people with these conditions may require more aggressive treatment. An abscess may need draining or IV antibiotics, for example.  MAKE SURE YOU    Understand these instructions.  Will watch your condition.  Will get help right away if you are not doing well or get worse.  Thank you for choosing an e-visit. Your e-visit answers were reviewed by a board certified advanced clinical practitioner to complete your personal care plan. Depending upon the condition, your plan could have included both over the counter or prescription medications. Please review your pharmacy choice. Make sure the pharmacy is open so you can pick up prescription now. If there is a problem, you may contact your provider through [employer] and have the  prescription routed to another pharmacy. Your safety is important to [place]. If you have drug allergies check your prescription carefully.  For the next 24 hours you can use MyChart to ask questions about today's visit, request a non-urgent call back, or ask for a work or school excuse. You will get an email in the next two days asking about your experience. I hope that your e-visit has been valuable and will speed your recovery.  Approximately 5 minutes was spent documenting and reviewing patient's chart.

## 2021-02-24 ENCOUNTER — Encounter (HOSPITAL_COMMUNITY): Payer: Self-pay | Admitting: Obstetrics and Gynecology

## 2021-02-24 ENCOUNTER — Inpatient Hospital Stay (HOSPITAL_COMMUNITY)
Admission: AD | Admit: 2021-02-24 | Discharge: 2021-02-24 | Disposition: A | Discharge status: Home / Self Care | Payer: Medicaid Other | Attending: Obstetrics and Gynecology | Admitting: Obstetrics and Gynecology

## 2021-02-24 ENCOUNTER — Other Ambulatory Visit: Payer: Self-pay

## 2021-02-24 DIAGNOSIS — O26893 Other specified pregnancy related conditions, third trimester: Secondary | ICD-10-CM

## 2021-02-24 DIAGNOSIS — R109 Unspecified abdominal pain: Secondary | ICD-10-CM | POA: Diagnosis not present

## 2021-02-24 DIAGNOSIS — Z3A28 28 weeks gestation of pregnancy: Secondary | ICD-10-CM | POA: Insufficient documentation

## 2021-02-24 DIAGNOSIS — E860 Dehydration: Secondary | ICD-10-CM | POA: Insufficient documentation

## 2021-02-24 DIAGNOSIS — O99283 Endocrine, nutritional and metabolic diseases complicating pregnancy, third trimester: Secondary | ICD-10-CM | POA: Diagnosis not present

## 2021-02-24 DIAGNOSIS — Z3689 Encounter for other specified antenatal screening: Secondary | ICD-10-CM

## 2021-02-24 LAB — URINALYSIS, ROUTINE W REFLEX MICROSCOPIC
Bilirubin Urine: NEGATIVE
Glucose, UA: NEGATIVE mg/dL
Hgb urine dipstick: NEGATIVE
Ketones, ur: 5 mg/dL — AB
Nitrite: NEGATIVE
Protein, ur: 100 mg/dL — AB
Specific Gravity, Urine: 1.028 (ref 1.005–1.030)
pH: 6 (ref 5.0–8.0)

## 2021-02-24 LAB — WET PREP, GENITAL
Clue Cells Wet Prep HPF POC: NONE SEEN
Sperm: NONE SEEN
Trich, Wet Prep: NONE SEEN

## 2021-02-24 NOTE — Discharge Instructions (Signed)
Abdominal Pain During Pregnancy Belly (abdominal) pain is common during pregnancy. There are many possible causes. Some causes are more serious than others. Sometimes the cause is not known. Always tell your doctor if you have belly pain. Follow these instructions at home:  Do not have sex or put anything in your vagina until your pain goes away completely.  Get plenty of rest until your pain gets better.  Drink enough fluid to keep your pee (urine) pale yellow.  Take over-the-counter and prescription medicines only as told by your doctor.  Keep all follow-up visits.   Contact a doctor if:  You keep having pain after resting.  Your pain gets worse after resting.  You have lower belly pain that: ? Comes and goes at regular times. ? Spreads to your back. ? Feels like menstrual cramps.  You have pain or burning when you pee (urinate). Get help right away if:  You have a fever or chills.  You feel like it is hard to breathe.  You have bleeding from your vagina.  You are leaking fluid or tissue from your vagina.  You vomit for more than 24 hours.  You have watery poop (diarrhea) for more than 24 hours.  Your baby is moving less than usual.  You feel very weak or faint.  You have very bad pain in your upper belly. Summary  Belly pain is common during pregnancy. There are many possible causes.  If you have belly pain during pregnancy, tell your doctor right away.  Keep all follow-up visits. This information is not intended to replace advice given to you by your health care provider. Make sure you discuss any questions you have with your health care provider. Document Revised: 08/02/2020 Document Reviewed: 08/02/2020 Elsevier Patient Education  2021 Elsevier Inc.   Dehydration, Adult Dehydration is condition in which there is not enough water or other fluids in the body. This happens when a person loses more fluids than he or she takes in. Important body parts cannot  work right without the right amount of fluids. Any loss of fluids from the body can cause dehydration. Dehydration can be mild, worse, or very bad. It should be treated right away to keep it from getting very bad. What are the causes? This condition may be caused by:  Conditions that cause loss of water or other fluids, such as: ? Watery poop (diarrhea). ? Vomiting. ? Sweating a lot. ? Peeing (urinating) a lot.  Not drinking enough fluids, especially when you: ? Are ill. ? Are doing things that take a lot of energy to do.  Other illnesses and conditions, such as fever or infection.  Certain medicines, such as medicines that take extra fluid out of the body (diuretics).  Lack of safe drinking water.  Not being able to get enough water and food. What increases the risk? The following factors may make you more likely to develop this condition:  Having a long-term (chronic) illness that has not been treated the right way, such as: ? Diabetes. ? Heart disease. ? Kidney disease.  Being 17 years of age or older.  Having a disability.  Living in a place that is high above the ground or sea (high in altitude). The thinner, dried air causes more fluid loss.  Doing exercises that put stress on your body for a long time. What are the signs or symptoms? Symptoms of dehydration depend on how bad it is. Mild or worse dehydration  Thirst.  Dry lips or  dry mouth.  Feeling dizzy or light-headed, especially when you stand up from sitting.  Muscle cramps.  Your body making: ? Dark pee (urine). Pee may be the color of tea. ? Less pee than normal. ? Less tears than normal.  Headache. Very bad dehydration  Changes in skin. Skin may: ? Be cold to the touch (clammy). ? Be blotchy or pale. ? Not go back to normal right after you lightly pinch it and let it go.  Little or no tears, pee, or sweat.  Changes in vital signs, such as: ? Fast breathing. ? Low blood pressure. ? Weak  pulse. ? Pulse that is more than 100 beats a minute when you are sitting still.  Other changes, such as: ? Feeling very thirsty. ? Eyes that look hollow (sunken). ? Cold hands and feet. ? Being mixed up (confused). ? Being very tired (lethargic) or having trouble waking from sleep. ? Short-term weight loss. ? Loss of consciousness. How is this treated? Treatment for this condition depends on how bad it is. Treatment should start right away. Do not wait until your condition gets very bad. Very bad dehydration is an emergency. You will need to go to a hospital.  Mild or worse dehydration can be treated at home. You may be asked to: ? Drink more fluids. ? Drink an oral rehydration solution (ORS). This drink helps get the right amounts of fluids and salts and minerals in the blood (electrolytes).  Very bad dehydration can be treated: ? With fluids through an IV tube. ? By getting normal levels of salts and minerals in your blood. This is often done by giving salts and minerals through a tube. The tube is passed through your nose and into your stomach. ? By treating the root cause. Follow these instructions at home: Oral rehydration solution If told by your doctor, drink an ORS:  Make an ORS. Use instructions on the package.  Start by drinking small amounts, about  cup (120 mL) every 5-10 minutes.  Slowly drink more until you have had the amount that your doctor said to have. Eating and drinking  Drink enough clear fluid to keep your pee pale yellow. If you were told to drink an ORS, finish the ORS first. Then, start slowly drinking other clear fluids. Drink fluids such as: ? Water. Do not drink only water. Doing that can make the salt (sodium) level in your body get too low. ? Water from ice chips you suck on. ? Fruit juice that you have added water to (diluted). ? Low-calorie sports drinks.  Eat foods that have the right amounts of salts and minerals, such  as: ? Bananas. ? Oranges. ? Potatoes. ? Tomatoes. ? Spinach.  Do not drink alcohol.  Avoid: ? Drinks that have a lot of sugar. These include:  High-calorie sports drinks.  Fruit juice that you did not add water to.  Soda.  Caffeine. ? Foods that are greasy or have a lot of fat or sugar.         General instructions  Take over-the-counter and prescription medicines only as told by your doctor.  Do not take salt tablets. Doing that can make the salt level in your body get too high.  Return to your normal activities as told by your doctor. Ask your doctor what activities are safe for you.  Keep all follow-up visits as told by your doctor. This is important. Contact a doctor if:  You have pain in your belly (abdomen)  and the pain: ? Gets worse. ? Stays in one place.  You have a rash.  You have a stiff neck.  You get angry or annoyed (irritable) more easily than normal.  You are more tired or have a harder time waking than normal.  You feel: ? Weak or dizzy. ? Very thirsty. Get help right away if you have:  Any symptoms of very bad dehydration.  Symptoms of vomiting, such as: ? You cannot eat or drink without vomiting. ? Your vomiting gets worse or does not go away. ? Your vomit has blood or green stuff in it.  Symptoms that get worse with treatment.  A fever.  A very bad headache.  Problems with peeing or pooping (having a bowel movement), such as: ? Watery poop that gets worse or does not go away. ? Blood in your poop (stool). This may cause poop to look black and tarry. ? Not peeing in 6-8 hours. ? Peeing only a small amount of very dark pee in 6-8 hours.  Trouble breathing. These symptoms may be an emergency. Do not wait to see if the symptoms will go away. Get medical help right away. Call your local emergency services (911 in the [place] Do not drive yourself to the hospital. Summary  Dehydration is a condition in which there is not enough  water or other fluids in the body. This happens when a person loses more fluids than he or she takes in.  Treatment for this condition depends on how bad it is. Treatment should be started right away. Do not wait until your condition gets very bad.  Drink enough clear fluid to keep your pee pale yellow. If you were told to drink an oral rehydration solution (ORS), finish the ORS first. Then, start slowly drinking other clear fluids.  Take over-the-counter and prescription medicines only as told by your doctor.  Get help right away if you have any symptoms of very bad dehydration. This information is not intended to replace advice given to you by your health care provider. Make sure you discuss any questions you have with your health care provider. Document Revised: 07/02/2019 Document Reviewed: 07/02/2019 Elsevier Patient Education  2021 [employer].

## 2021-02-24 NOTE — MAU Note (Signed)
Taylor [name] is a 25 y.o. here in MAU reporting: abdominal cramping for the past 20 minutes. Pain is constant. Denies bleeding or LOF. +FM  Onset of complaint: today  Pain score: 8/10  Vitals:   02/24/21 1217  BP: 118/72  Pulse: (!) 110  Resp: 16  Temp: 98.7 F (37.1 C)  SpO2: 98%     FHT:155  Lab orders placed from triage: UA

## 2021-02-24 NOTE — MAU Provider Note (Addendum)
History     CSN: [id number]  Arrival date and time: 02/24/21 1149   Event Date/Time   First Provider Initiated Contact with Patient 02/24/21 1303      Chief Complaint  Patient presents with  . Abdominal Pain   24 y.o. G2P0010 @28 .0 presenting with low abdominal cramping. Reports onset 1 hr ago. Rates pain 6/10. Has not tried anything for it. Denies urinary sx. Denies VB, discharge or LOF. Reports good FM. Admit to no water intake today. She has been getting care at [facility] and her pregnancy has been uncomplicated.   OB History    Gravida  2   Para      Term      Preterm      AB  1   Living        SAB      IAB      Ectopic  1   Multiple      Live Births              History reviewed. No pertinent past medical history.  History reviewed. No pertinent surgical history.  History reviewed. No pertinent family history.  Social History   Tobacco Use  . Smoking status: Never Smoker  . Smokeless tobacco: Never Used  Substance Use Topics  . Alcohol use: Never  . Drug use: Never    Allergies: No Known Allergies  No medications prior to admission.    Review of Systems  Constitutional: Negative for chills and fever.  Gastrointestinal: Positive for abdominal pain.  Genitourinary: Negative for dysuria, frequency, hematuria, urgency, vaginal bleeding and vaginal discharge.   Physical Exam   Blood pressure 128/72, pulse 93, temperature 98.7 F (37.1 C), temperature source Oral, resp. rate 15, height 5\' 10"  (1.778 m), weight 94 kg, last menstrual period 03/06/2020, SpO2 98 %, unknown if currently breastfeeding.  Physical Exam Vitals and nursing note reviewed. Exam conducted with a chaperone present.  Constitutional:      General: Distressed: appears uncomfortable.     Appearance: Normal appearance.  HENT:     Head: Normocephalic and atraumatic.  Pulmonary:     Effort: Pulmonary effort is normal. No respiratory distress.  Abdominal:      Palpations: Abdomen is soft.     Tenderness: There is no abdominal tenderness.  Genitourinary:    Comments: SVE: closed/thick Musculoskeletal:        General: Normal range of motion.     Cervical back: Normal range of motion.  Skin:    General: Skin is warm and dry.  Neurological:     General: No focal deficit present.     Mental Status: She is alert and oriented to person, place, and time.  Psychiatric:        Mood and Affect: Mood normal.        Behavior: Behavior normal.   EFM: 145 bpm, mod variability, + accels, no decels Toco: none  Results for orders placed or performed during the hospital encounter of 02/24/21 (from the past 24 hour(s))  Wet prep, genital     Status: Abnormal   Collection Time: 02/24/21  1:12 PM  Result Value Ref Range   Yeast Wet Prep HPF POC PRESENT (A) NONE SEEN   Trich, Wet Prep NONE SEEN NONE SEEN   Clue Cells Wet Prep HPF POC NONE SEEN NONE SEEN   WBC, Wet Prep HPF POC FEW (A) NONE SEEN   Sperm NONE SEEN   Urinalysis, Routine w reflex microscopic Urine,  Clean Catch     Status: Abnormal   Collection Time: 02/24/21  1:35 PM  Result Value Ref Range   Color, Urine AMBER (A) YELLOW   APPearance HAZY (A) CLEAR   Specific Gravity, Urine 1.028 1.005 - 1.030   pH 6.0 5.0 - 8.0   Glucose, UA NEGATIVE NEGATIVE mg/dL   Hgb urine dipstick NEGATIVE NEGATIVE   Bilirubin Urine NEGATIVE NEGATIVE   Ketones, ur 5 (A) NEGATIVE mg/dL   Protein, ur 950 (A) NEGATIVE mg/dL   Nitrite NEGATIVE NEGATIVE   Leukocytes,Ua TRACE (A) NEGATIVE   RBC / HPF 0-5 0 - 5 RBC/hpf   WBC, UA 11-20 0 - 5 WBC/hpf   Bacteria, UA RARE (A) NONE SEEN   Squamous Epithelial / LPF 6-10 0 - 5   Mucus PRESENT    MAU Course  Procedures Po fluids  MDM Labs ordered and reviewed. No signs of PTL or UTI. Discussed importance of daily water intake. Stable for discharge home  Assessment and Plan   1. [redacted] weeks gestation of pregnancy   2. NST (non-stress test) reactive   3. Dehydration    4. Abdominal cramping affecting pregnancy    Discharge home Follow up at [facility] to resume care (transferring) PTL precautions  Allergies as of 02/24/2021   No Known Allergies     Medication List    TAKE these medications   amoxicillin 500 MG capsule Commonly known as: AMOXIL Take 1 capsule (500 mg total) by mouth 3 (three) times daily.   prenatal multivitamin Tabs tablet Take 1 tablet by mouth daily at 12 noon.       Donette Larry, CNM 02/24/2021, 3:27 PM

## 2021-02-25 LAB — CULTURE, OB URINE: Culture: 30000 — AB

## 2021-02-27 LAB — GC/CHLAMYDIA PROBE AMP (~~LOC~~) NOT AT ARMC
Chlamydia: NEGATIVE
Comment: NEGATIVE
Comment: NORMAL
Neisseria Gonorrhea: NEGATIVE

## 2021-02-28 ENCOUNTER — Encounter: Payer: Self-pay | Admitting: Medical

## 2021-02-28 ENCOUNTER — Other Ambulatory Visit: Payer: Self-pay | Admitting: *Deleted

## 2021-02-28 DIAGNOSIS — Z3480 Encounter for supervision of other normal pregnancy, unspecified trimester: Secondary | ICD-10-CM | POA: Insufficient documentation

## 2021-03-02 ENCOUNTER — Other Ambulatory Visit (INDEPENDENT_AMBULATORY_CARE_PROVIDER_SITE_OTHER): Payer: Medicaid Other | Admitting: *Deleted

## 2021-03-02 ENCOUNTER — Other Ambulatory Visit: Payer: Self-pay

## 2021-03-02 DIAGNOSIS — Z3480 Encounter for supervision of other normal pregnancy, unspecified trimester: Secondary | ICD-10-CM

## 2021-03-02 NOTE — Progress Notes (Signed)
    Patient in clinic for 28 week labs.   Tae Vonada L, RN  

## 2021-03-03 LAB — GLUCOSE TOLERANCE, 2 HOURS W/ 1HR
Glucose, 1 hour: 118 mg/dL (ref 65–179)
Glucose, 2 hour: 96 mg/dL (ref 65–152)
Glucose, Fasting: 82 mg/dL (ref 65–91)

## 2021-03-03 LAB — CBC
Hematocrit: 34.2 % (ref 34.0–46.6)
Hemoglobin: 11.6 g/dL (ref 11.1–15.9)
MCH: 30.3 pg (ref 26.6–33.0)
MCHC: 33.9 g/dL (ref 31.5–35.7)
MCV: 89 fL (ref 79–97)
Platelets: 174 10*3/uL (ref 150–450)
RBC: 3.83 x10E6/uL (ref 3.77–5.28)
RDW: 12.8 % (ref 11.7–15.4)
WBC: 7.1 10*3/uL (ref 3.4–10.8)

## 2021-03-03 LAB — HIV ANTIBODY (ROUTINE TESTING W REFLEX): HIV Screen 4th Generation wRfx: NONREACTIVE

## 2021-03-03 LAB — RPR: RPR Ser Ql: NONREACTIVE

## 2021-03-08 ENCOUNTER — Other Ambulatory Visit: Payer: Self-pay

## 2021-03-08 ENCOUNTER — Ambulatory Visit (INDEPENDENT_AMBULATORY_CARE_PROVIDER_SITE_OTHER): Payer: Medicaid Other | Admitting: Medical

## 2021-03-08 ENCOUNTER — Encounter: Payer: Self-pay | Admitting: Medical

## 2021-03-08 VITALS — BP 121/83 | HR 83 | Temp 98.6°F | Wt 209.0 lb

## 2021-03-08 DIAGNOSIS — Z3480 Encounter for supervision of other normal pregnancy, unspecified trimester: Secondary | ICD-10-CM

## 2021-03-08 DIAGNOSIS — Z3A29 29 weeks gestation of pregnancy: Secondary | ICD-10-CM

## 2021-03-08 MED ORDER — GOJJI WEIGHT SCALE MISC: 1.0000 | Freq: Every day | 0 refills | Status: AC | PRN

## 2021-03-08 MED ORDER — BLOOD PRESSURE MONITOR AUTOMAT DEVI: 1.0000 | Freq: Every day | 0 refills | Status: AC

## 2021-03-08 NOTE — Progress Notes (Signed)
   PRENATAL VISIT NOTE  Subjective:  Taylor [name] is a 25 y.o. G2P0010 at [redacted]w[redacted]d being seen today for her first prenatal visit in our office for this pregnancy.  She is a transfer from [facility] in [place], [place]. She is currently monitored for the following issues for this low-risk pregnancy and has Chlamydia infection and Supervision of other normal pregnancy, antepartum on their problem list.  Patient reports no complaints. States occasional BH contractions noted every few days.  Contractions: Irritability. Vag. Bleeding: None.  Movement: Present. Denies leaking of fluid.   She is planning to breastfeed. Declines contraception.   The following portions of the patient's history were reviewed and updated as appropriate: allergies, current medications, past family history, past medical history, past social history, past surgical history and problem list.   Objective:   Vitals:   03/08/21 1506  BP: 121/83  Pulse: 83  Temp: 98.6 F (37 C)  Weight: 209 lb (94.8 kg)    Fetal Status: Fetal Heart Rate (bpm): 152 Fundal Height: 30 cm Movement: Present     General:  Alert, oriented and cooperative. Patient is in no acute distress.  Skin: Skin is warm and dry. No rash noted.   Cardiovascular: Normal heart rate noted  Respiratory: Normal respiratory effort, no problems with respiration noted  Abdomen: Soft, gravid, appropriate for gestational age. Pain/Pressure: Absent     Pelvic:  Deferred   Extremities: Normal range of motion.  Edema: None  Mental Status: Normal mood and affect. Normal behavior. Normal judgment and thought content.   Assessment and Plan:  Pregnancy: G2P0010 at [redacted]w[redacted]d 1. Supervision of other normal pregnancy, antepartum - Blood Pressure Monitoring (BLOOD PRESSURE MONITOR AUTOMAT) DEVI; 1 Device by Does not apply route daily. Automatic blood pressure cuff regular size. To monitor blood pressure regularly at home. ICD-10 code:Z34.90  Dispense: 1 each; Refill: 0 -  Misc. Devices (GOJJI WEIGHT SCALE) MISC; 1 Device by Does not apply route daily as needed. To weight self daily as needed at home. ICD-10 code: Z34.90  Dispense: 1 each; Refill: 0 - Prenatal records from previous OB reviewed, up-to-date on all labs and [place]  - Normal 28 weeks labs from last week reviewed   2. [redacted] weeks gestation of pregnancy  Preterm labor/first trimester warning symptoms and general obstetric precautions including but not limited to vaginal bleeding, contractions, leaking of fluid and fetal movement were reviewed in detail with the patient. Please refer to After Visit Summary for other counseling recommendations.   Discussed the normal visit cadence for prenatal care Discussed the nature of our practice with multiple providers including residents and students   Return in about 2 weeks (around 03/22/2021) for LOB, In-Person.  Future Appointments  Date Time Provider Department Center  03/23/2021  9:30 AM Raelyn Mora, CNM [facility] None    Vonzella Nipple, PA-C

## 2021-03-08 NOTE — Patient Instructions (Addendum)
AREA PEDIATRIC/FAMILY PRACTICE PHYSICIANS  [facility] [address] [place], [place] [address] Phone - [phone]   Fax - [phone]  JACK AMOS [address] [place], [place]  [address] Phone - [phone]   Fax - [phone]  [facility] [address] [place], [place]  [address] Phone - [phone]   Fax - [phone]  [facility] [address] [place], [place]  [address] Phone - [phone]   Fax - [phone]  [facility] [address], [address] [place], [place]  [address] Phone - [phone]   Fax - [phone]  [facility] [address] [place], [place]  [address] Phone - [phone]   Fax - [phone]  [facility] [address] [place], [place]  [address] Phone - [phone]   Fax - [phone]  [facility] [address] [place], [address] [place], [place]  [address] Phone - [phone]   Fax - [phone]  [facility] AT [facility] [address] [place], [place]  [address] Phone - [phone]   Fax - [phone] [facility] [place] [address] [place], [place]  [address] Phone - [phone]   Fax - [phone]  [facility] [address] [place], [place]  [address] Phone - [phone]   Fax - [phone]  [facility] [address] [place], [place]  [address] Phone - [phone]   Fax - [phone]  [facility] [address] [place], [place]  [address] Phone - [phone]   Fax - [phone]  [facility] [address] [place], [place]  [address] Phone - [phone]  [facility] [address] [place], [place]  [address] Phone - [phone]   Fax - [phone]  [facility] [address] [place], [place]  [address] Phone - [phone]   Fax - [phone]  [facility] [address] [place], [place]  [address] Phone - [phone]   Fax - [phone]  [facility] [address] [place], [place]  [address] Phone - [phone]   Fax - [phone]   [facility] [address] [place], [place]  [address] Phone - [phone]   Fax - [phone]  [facility] [address] [place], [place]  [address] Phone - [phone]   Fax - [phone]  DAVID RUBIN [address] [place], [place]  [address] Phone - [phone]   Fax - [phone]  [facility] [address] [place], [place]  [address] Phone - [phone]   Fax - [phone]  [facility] [address] [place], [place]  [address] Phone - [phone]   Fax - [phone] Gerarda Fraction [address] [place], [place]  [address] Phone - [phone]   Fax - [phone]  [facility] [place] [facility] [address] [place], [place]  [address] Phone - [phone]   Fax - [phone]  [facility] [place] [address] [place], [place]  [address] Phone - [phone]   Fax - [phone]   Fetal Movement Counts Patient Name: ________________________________________________ Patient Due Date: ____________________  What is a fetal movement count? A fetal movement count is the number of times that you feel your baby move  during a certain amount of time. This may also be called a fetal kick count. A fetal movement count is recommended for every pregnant woman. You may be asked to start counting fetal movements as early as week 28 of your pregnancy. Pay attention to when your baby is most active. You may notice your baby's sleep and wake cycles. You may also notice things that make your baby move more. You  should do a fetal movement count:  When your baby is normally most active.  At the same time each day. A good time to count movements is while you are resting, after having something to eat and drink. How do I count fetal movements? 1. Find a quiet, comfortable area. Sit, or lie down on your side. 2. Write down the date, the start time and stop time, and the number of movements that you felt between those two times. Take this information with you to your health care visits. 3. Write down your start time when you feel the first movement. 4. Count kicks, flutters, swishes, rolls, and jabs. You should feel at least 10 movements. 5. You may stop counting after you have felt 10 movements, or if you have been counting for 2 hours. Write down the stop time. 6. If you do not feel 10 movements in 2 hours, contact your health care provider for further instructions. Your health care provider may want to do additional tests to assess your baby's well-being. Contact a health care provider if:  You feel fewer than 10 movements in 2 hours.  Your baby is not moving like he or she usually does. Date: ____________ Start time: ____________ Stop time: ____________ Movements: ____________ Date: ____________ Start time: ____________ Stop time: ____________ Movements: ____________ Date: ____________ Start time: ____________ Stop time: ____________ Movements: ____________ Date: ____________ Start time: ____________ Stop time: ____________ Movements: ____________ Date: ____________ Start time: ____________ Stop time: ____________ Movements: ____________ Date: ____________ Start time: ____________ Stop time: ____________ Movements: ____________ Date: ____________ Start time: ____________ Stop time: ____________ Movements: ____________ Date: ____________ Start time: ____________ Stop time: ____________ Movements: ____________ Date: ____________ Start time: ____________ Stop time: ____________ Movements:  ____________ This information is not intended to replace advice given to you by your health care provider. Make sure you discuss any questions you have with your health care provider. Document Revised: 07/09/2019 Document Reviewed: 07/09/2019 Elsevier Patient Education  2021 Elsevier Inc.  Rosen's Emergency Medicine: Concepts and Clinical Practice (9th ed., pp. 2296- 2312). Elsevier.">  Braxton Hicks Contractions Contractions of the uterus can occur throughout pregnancy, but they are not always a sign that you are in labor. You may have practice contractions called Braxton Hicks contractions. These false labor contractions are sometimes confused with true labor. What are Deberah Pelton contractions? Braxton Hicks contractions are tightening movements that occur in the muscles of the uterus before labor. Unlike true labor contractions, these contractions do not result in opening (dilation) and thinning of the cervix. Toward the end of pregnancy (32-34 weeks), Braxton Hicks contractions can happen more often and may become stronger. These contractions are sometimes difficult to tell apart from true labor because they can be very uncomfortable. You should not feel embarrassed if you go to the hospital with false labor. Sometimes, the only way to tell if you are in true labor is for your health care provider to look for changes in the cervix. The health care provider will do a physical exam and may monitor your contractions. If you are not in true  labor, the exam should show that your cervix is not dilating and your water has not broken. If there are no other health problems associated with your pregnancy, it is completely safe for you to be sent home with false labor. You may continue to have Braxton Hicks contractions until you go into true labor. How to tell the difference between true labor and false labor True labor  Contractions last 30-70 seconds.  Contractions become very regular.  Discomfort  is usually felt in the top of the uterus, and it spreads to the lower abdomen and low back.  Contractions do not go away with walking.  Contractions usually become more intense and increase in frequency.  The cervix dilates and gets thinner. False labor  Contractions are usually shorter and not as strong as true labor contractions.  Contractions are usually irregular.  Contractions are often felt in the front of the lower abdomen and in the groin.  Contractions may go away when you walk around or change positions while lying down.  Contractions get weaker and are shorter-lasting as time goes on.  The cervix usually does not dilate or become thin. Follow these instructions at home:  Take over-the-counter and prescription medicines only as told by your health care provider.  Keep up with your usual exercises and follow other instructions from your health care provider.  Eat and drink lightly if you think you are going into labor.  If Braxton Hicks contractions are making you uncomfortable: ? Change your position from lying down or resting to walking, or change from walking to resting. ? Sit and rest in a tub of warm water. ? Drink enough fluid to keep your urine pale yellow. Dehydration may cause these contractions. ? Do slow and deep breathing several times an hour.  Keep all follow-up prenatal visits as told by your health care provider. This is important.   Contact a health care provider if:  You have a fever.  You have continuous pain in your abdomen. Get help right away if:  Your contractions become stronger, more regular, and closer together.  You have fluid leaking or gushing from your vagina.  You pass blood-tinged mucus (bloody show).  You have bleeding from your vagina.  You have low back pain that you never had before.  You feel your baby's head pushing down and causing pelvic pressure.  Your baby is not moving inside you as much as it used  to. Summary  Contractions that occur before labor are called Braxton Hicks contractions, false labor, or practice contractions.  Braxton Hicks contractions are usually shorter, weaker, farther apart, and less regular than true labor contractions. True labor contractions usually become progressively stronger and regular, and they become more frequent.  Manage discomfort from [facility] contractions by changing position, resting in a warm bath, drinking plenty of water, or practicing deep breathing. This information is not intended to replace advice given to you by your health care provider. Make sure you discuss any questions you have with your health care provider. Document Revised: 11/01/2017 Document Reviewed: 04/04/2017 Elsevier Patient Education  2021 [employer].

## 2021-03-18 ENCOUNTER — Encounter (HOSPITAL_COMMUNITY): Payer: Self-pay | Admitting: Obstetrics & Gynecology

## 2021-03-18 ENCOUNTER — Inpatient Hospital Stay (HOSPITAL_COMMUNITY)
Admission: AD | Admit: 2021-03-18 | Discharge: 2021-03-19 | Disposition: A | Discharge status: Home / Self Care | Payer: Medicaid Other | Attending: Obstetrics & Gynecology | Admitting: Obstetrics & Gynecology

## 2021-03-18 ENCOUNTER — Other Ambulatory Visit: Payer: Self-pay

## 2021-03-18 DIAGNOSIS — O4703 False labor before 37 completed weeks of gestation, third trimester: Secondary | ICD-10-CM | POA: Insufficient documentation

## 2021-03-18 DIAGNOSIS — Z3A30 30 weeks gestation of pregnancy: Secondary | ICD-10-CM

## 2021-03-18 DIAGNOSIS — O09899 Supervision of other high risk pregnancies, unspecified trimester: Secondary | ICD-10-CM

## 2021-03-18 LAB — URINALYSIS, ROUTINE W REFLEX MICROSCOPIC
Bilirubin Urine: NEGATIVE
Glucose, UA: NEGATIVE mg/dL
Hgb urine dipstick: NEGATIVE
Ketones, ur: NEGATIVE mg/dL
Leukocytes,Ua: NEGATIVE
Nitrite: NEGATIVE
Protein, ur: NEGATIVE mg/dL
Specific Gravity, Urine: 1.006 (ref 1.005–1.030)
pH: 7 (ref 5.0–8.0)

## 2021-03-18 NOTE — MAU Note (Signed)
..  Taylor [name] is a 25 y.o. at [redacted]w[redacted]d here in MAU reporting: Constant abdominal cramping that began around 10:30pm. Denies vaginal bleeding or LOF. +FM. Pain score: 5/10 Vitals:   03/18/21 2318  BP: 115/65  Pulse: (!) 101  Resp: 16  Temp: 98.4 F (36.9 C)  SpO2: 98%   FHT: 140 Lab orders placed from triage: UA

## 2021-03-18 NOTE — MAU Provider Note (Signed)
History     CSN: [id number]  Arrival date and time: 03/18/21 2300   Event Date/Time   First Provider Initiated Contact with Patient 03/18/21 2354      Chief Complaint  Patient presents with  . Abdominal Pain   HPI  [name] is a 25 y.o. femel G2P0010 @ [redacted]w[redacted]d here in MAU with abdominal pain/cramping. The pain started 1 hour prior to her coming in. She had this exact pain 1 month ago and was seen for it. She reports eating a taco salad and shortly after the pain started. The pain is located in her lower abdomen; the pain feels crampy.  The pain comes and goes. She has not tried any medication for it. No bleeding. No recent intercourse. + fetal movement.    OB History    Gravida  2   Para      Term      Preterm      AB  1   Living        SAB      IAB      Ectopic  1   Multiple      Live Births              Past Medical History:  Diagnosis Date  . HSV (herpes simplex virus) infection     History reviewed. No pertinent surgical history.  No family history on file.  Social History   Tobacco Use  . Smoking status: Never Smoker  . Smokeless tobacco: Never Used  Vaping Use  . Vaping Use: Never used  Substance Use Topics  . Alcohol use: Never  . Drug use: Never    Allergies: No Known Allergies  Medications Prior to Admission  Medication Sig Dispense Refill Last Dose  . Prenatal Vit-Fe Fumarate-FA (PRENATAL MULTIVITAMIN) TABS tablet Take 1 tablet by mouth daily at 12 noon.   03/18/2021 at Unknown time  . Blood Pressure Monitoring (BLOOD PRESSURE MONITOR AUTOMAT) DEVI 1 Device by Does not apply route daily. Automatic blood pressure cuff regular size. To monitor blood pressure regularly at home. ICD-10 code:Z34.90 1 each 0   . Misc. Devices (GOJJI WEIGHT SCALE) MISC 1 Device by Does not apply route daily as needed. To weight self daily as needed at home. ICD-10 code: Z34.90 1 each 0    Results for orders placed or performed during the hospital  encounter of 03/18/21 (from the past 48 hour(s))  Urinalysis, Routine w reflex microscopic Urine, Clean Catch     Status: None   Collection Time: 03/18/21 11:32 PM  Result Value Ref Range   Color, Urine YELLOW YELLOW   APPearance CLEAR CLEAR   Specific Gravity, Urine 1.006 1.005 - 1.030   pH 7.0 5.0 - 8.0   Glucose, UA NEGATIVE NEGATIVE mg/dL   Hgb urine dipstick NEGATIVE NEGATIVE   Bilirubin Urine NEGATIVE NEGATIVE   Ketones, ur NEGATIVE NEGATIVE mg/dL   Protein, ur NEGATIVE NEGATIVE mg/dL   Nitrite NEGATIVE NEGATIVE   Leukocytes,Ua NEGATIVE NEGATIVE    Comment: Performed at [facility], [address] [place], [place] [address]  Fetal fibronectin     Status: Abnormal   Collection Time: 03/19/21  1:03 AM  Result Value Ref Range   Fetal Fibronectin POSITIVE (A) NEGATIVE    Comment: Performed at [facility], [address] [place], [place] [address]   Review of Systems  Constitutional: Negative for fever.  Gastrointestinal: Positive for abdominal pain.  Genitourinary: Negative for dysuria,  vaginal bleeding and vaginal discharge.   Physical Exam   Blood pressure 115/65, pulse (!) 101, temperature 98.4 F (36.9 C), temperature source Oral, resp. rate 16, height 5\' 10"  (1.778 m), weight 94.6 kg, last menstrual period 08/17/2020, SpO2 98 %, unknown if currently breastfeeding.  Physical Exam Constitutional:      General: She is not in acute distress.    Appearance: She is well-developed. She is not ill-appearing, toxic-appearing or diaphoretic.  HENT:     Head: Normocephalic.  Abdominal:     Tenderness: There is no abdominal tenderness (Soft).  Genitourinary:    Comments: Cervix: closed, anterior.  FFN collected Exam by 08/19/2020 NP Neurological:     Mental Status: She is alert and oriented to person, place, and time.   Fetal Tracing: Baseline: 135 bpm Variability: Moderate  Accelerations: 10x10 and 15x15 Decelerations: variables  Toco: UI  MAU Course   Procedures  None  MDM  Cervix closed FFN positive BMZ given  Patient as no pain at the time of DC.   Assessment and Plan   A:  1. Threatened premature labor in third trimester   2. [redacted] weeks gestation of pregnancy   3. Positive fetal fibronectin at 22 weeks to [redacted] weeks gestation     P:  Discharge home in stable condition Preterm labor precautions Return in 24 hours for 2nd BMZ injection Stay well hydrated  Return to MAU if symptoms worsen  Skyleen Bentley, Victorino Dike, NP 03/19/2021 9:27 AM

## 2021-03-19 DIAGNOSIS — Z3A30 30 weeks gestation of pregnancy: Secondary | ICD-10-CM

## 2021-03-19 DIAGNOSIS — O4703 False labor before 37 completed weeks of gestation, third trimester: Secondary | ICD-10-CM | POA: Diagnosis present

## 2021-03-19 LAB — FETAL FIBRONECTIN: Fetal Fibronectin: POSITIVE — AB

## 2021-03-19 MED ORDER — NIFEDIPINE 10 MG PO CAPS
10.0000 mg | ORAL_CAPSULE | Freq: Once | ORAL | Status: AC
  Administered 2021-03-19: 10 mg via ORAL
  Filled 2021-03-19: qty 1

## 2021-03-19 MED ORDER — BETAMETHASONE SOD PHOS & ACET 6 (3-3) MG/ML IJ SUSP
12.0000 mg | Freq: Once | INTRAMUSCULAR | Status: AC
  Administered 2021-03-19: 12 mg via INTRAMUSCULAR
  Filled 2021-03-19: qty 5

## 2021-03-19 MED ORDER — NIFEDIPINE 10 MG PO CAPS: 10.0000 mg | ORAL_CAPSULE | Freq: Three times a day (TID) | ORAL | 1 refills | Status: AC

## 2021-03-19 MED ORDER — ACETAMINOPHEN 500 MG PO TABS
1000.0000 mg | ORAL_TABLET | Freq: Once | ORAL | Status: AC
  Administered 2021-03-19: 1000 mg via ORAL
  Filled 2021-03-19: qty 2

## 2021-03-19 NOTE — Discharge Instructions (Signed)
Preventing Preterm Birth Preterm birth is when a baby is delivered between 20 weeks and 37 weeks of pregnancy. A full-term pregnancy lasts for at least 37 weeks. Preterm birth can be dangerous for your baby because the last few weeks of pregnancy are an important time for your baby to grow and reach a normal birth weight. How can preterm birth affect my baby? Complications of preterm birth may include:  Breathing problems.  Brain damage that affects movement and coordination (cerebral palsy).  Trouble with feeding.  Problems with vision or hearing.  Infections or inflammation of the digestive tract (colitis).  Developmental delays and learning disabilities.  Low birth weight or very low birth weight.  Higher risk for diabetes, heart disease, and high blood pressure later in life. What can increase my risk of having a preterm birth? The exact cause of preterm birth is unknown. The following factors make you more likely to have a preterm birth:  Being diagnosed with placenta previa. This is a condition in which the placenta covers the lowest part of your uterus (cervix), which opens into the vagina.  Certain conditions of your current and past pregnancies, such as: ? Having had a preterm birth before. ? Being pregnant with multiples. ? Waiting less than 6 months between giving birth and becoming pregnant again. ? Certain abnormalities in your unborn baby. ? Vaginal bleeding during pregnancy. ? Becoming pregnant through in vitro fertilization (IVF).  Being overweight or underweight.  Medical history of: ? STIs (sexually transmitted infections) or other infections of the urinary tract and the vagina. ? Long-term (chronic) illnesses, such as blood clotting problems, diabetes, or high blood pressure. ? Short cervix.  Lifestyle and environmental factors, such as: ? Using tobacco products or drugs. ? Drinking alcohol. ? Having stress and no social support. ? Violence in the home  (domestic violence). ? Being exposed to certain chemicals or pollutants in the environment. What actions can I take to prevent preterm birth? Medical care The most important thing you can do to lower your risk for preterm birth is to get routine medical care during pregnancy (prenatal care). Keep all follow-up visits as told by your health care provider. This is important.  If you have a high risk of preterm birth:  You may be referred to a health care provider who specializes in managing high-risk pregnancies (perinatologist).  You may be given medicine to help prevent preterm birth. Lifestyle Certain lifestyle changes can also lower your risk of preterm birth:  Wait at least 6 months after a pregnancy to become pregnant again.  Get to a healthy weight before getting pregnant. If you are overweight, work with your health care provider to safely lose weight.  Do not use any products that contain nicotine or tobacco, such as cigarettes, e-cigarettes, and chewing tobacco. If you need help quitting, ask your health care provider.  Do not drink alcohol.  Do not use drugs.  Eat a healthy diet.  Manage other medical problems, such as diabetes or high blood pressure.   Where to find support For more support, consider:  Talking with your health care provider.  Talking with a therapist or substance abuse counselor, if you need help quitting.  Working with a dietitian or a personal trainer to maintain a healthy weight.  Joining a support group. Where to find more information Learn more about preventing preterm birth from:  Centers for Disease Control and Prevention: cdc.gov  March of Dimes: marchofdimes.org  American Pregnancy Association: americanpregnancy.org Contact a   health care provider if: You have any of the following signs or symptoms of preterm labor before 37 weeks:  A change or increase in vaginal discharge.  Fluid leaking from your vagina.  Pressure or cramps in  your lower abdomen.  A backache that does not go away or gets worse.  Regular tightening (contractions) in your lower abdomen. Get help right away if:  You are having regular painful contractions every 5 minutes or less.  Your water breaks. Summary  Preterm birth means having your baby during weeks 20-37 of pregnancy.  Preterm birth may put your baby at risk for physical and mental problems.  The exact cause of preterm birth is unknown. However, being diagnosed with placenta previa or having vaginal bleeding or an STI (sexually transmitted infection) increases your risk for preterm birth.  Getting good prenatal care can help prevent preterm birth. Keep all follow-up visits as told by your health care provider. This is important.  Contact a health care provider if you have signs or symptoms of preterm labor. This information is not intended to replace advice given to you by your health care provider. Make sure you discuss any questions you have with your health care provider. Document Revised: 10/26/2019 Document Reviewed: 10/26/2019 Elsevier Patient Education  2021 Elsevier Inc.  

## 2021-03-20 ENCOUNTER — Telehealth: Payer: Self-pay | Admitting: *Deleted

## 2021-03-20 ENCOUNTER — Other Ambulatory Visit: Payer: Self-pay

## 2021-03-20 ENCOUNTER — Inpatient Hospital Stay (HOSPITAL_COMMUNITY)
Admission: AD | Admit: 2021-03-20 | Discharge: 2021-03-20 | Disposition: A | Discharge status: Home / Self Care | Payer: Medicaid Other | Attending: Family Medicine | Admitting: Family Medicine

## 2021-03-20 DIAGNOSIS — Z3480 Encounter for supervision of other normal pregnancy, unspecified trimester: Secondary | ICD-10-CM | POA: Diagnosis not present

## 2021-03-20 MED ORDER — BETAMETHASONE SOD PHOS & ACET 6 (3-3) MG/ML IJ SUSP
12.0000 mg | Freq: Once | INTRAMUSCULAR | Status: AC
  Administered 2021-03-20: 12 mg via INTRAMUSCULAR

## 2021-03-20 NOTE — MAU Provider Note (Signed)
Patient seen for second BMZ. MSE done. No complaints or problems.

## 2021-03-20 NOTE — Telephone Encounter (Signed)
Patient called requesting an ultrasound to be completed. Patient was seen at MAU on 03/18/2021 for abdominal cramping and pain. Patient tested positive for fetal fibronectin at 22-[redacted] weeks gestation. Patient was given BMZ injection and second dosage today 03/20/2021. Patient has an upcoming appointment with Raelyn Mora, CNM on 03/23/21. Will forward message.   Clovis Pu, RN

## 2021-03-20 NOTE — MAU Note (Signed)
Pt here for 2nd Betamethasone injection.  No problems with first yesterday. No complaints offered.  Denies pain or bleeding.

## 2021-03-21 ENCOUNTER — Other Ambulatory Visit: Payer: Self-pay

## 2021-03-21 ENCOUNTER — Inpatient Hospital Stay (HOSPITAL_COMMUNITY)
Admission: AD | Admit: 2021-03-21 | Discharge: 2021-03-21 | Disposition: A | Discharge status: Home / Self Care | Payer: Medicaid Other | Attending: Obstetrics and Gynecology | Admitting: Obstetrics and Gynecology

## 2021-03-21 ENCOUNTER — Encounter (HOSPITAL_COMMUNITY): Payer: Self-pay | Admitting: Obstetrics and Gynecology

## 2021-03-21 DIAGNOSIS — O26893 Other specified pregnancy related conditions, third trimester: Secondary | ICD-10-CM | POA: Insufficient documentation

## 2021-03-21 DIAGNOSIS — O99891 Other specified diseases and conditions complicating pregnancy: Secondary | ICD-10-CM

## 2021-03-21 DIAGNOSIS — Z3A30 30 weeks gestation of pregnancy: Secondary | ICD-10-CM | POA: Diagnosis not present

## 2021-03-21 DIAGNOSIS — M79605 Pain in left leg: Secondary | ICD-10-CM

## 2021-03-21 DIAGNOSIS — M549 Dorsalgia, unspecified: Secondary | ICD-10-CM | POA: Diagnosis present

## 2021-03-21 LAB — URINALYSIS, ROUTINE W REFLEX MICROSCOPIC
Bilirubin Urine: NEGATIVE
Glucose, UA: NEGATIVE mg/dL
Hgb urine dipstick: NEGATIVE
Ketones, ur: NEGATIVE mg/dL
Leukocytes,Ua: NEGATIVE
Nitrite: NEGATIVE
Protein, ur: NEGATIVE mg/dL
Specific Gravity, Urine: 1.008 (ref 1.005–1.030)
pH: 7 (ref 5.0–8.0)

## 2021-03-21 MED ORDER — ACETAMINOPHEN 500 MG PO TABS
1000.0000 mg | ORAL_TABLET | Freq: Once | ORAL | Status: AC
  Administered 2021-03-21: 1000 mg via ORAL
  Filled 2021-03-21: qty 2

## 2021-03-21 NOTE — MAU Provider Note (Signed)
History     CSN: [id number]  Arrival date and time: 03/21/21 1725   Event Date/Time   First Provider Initiated Contact with Patient 03/21/21 1759      Chief Complaint  Patient presents with  . Back Pain  . Contractions   HPI Taylor [name] is a 25 y.o. G2P0010 at [redacted]w[redacted]d who presents with left buttocks pain. Symptoms started this morning. Reports pain is in the same location where she received her second betamethasone injection yesterday. Reports tight/sharp pain in her left buttocks that radiates to her left thigh. Pain is constant. Rates pain 8/10. Lying on her right side makes pain worse. Hasn't treated symptoms.  Also reports contractions have continued but are less severe and only occur 1-2 times per hour. Denies fever/chills, dysuria, vaginal bleeding, or LOF. Reports good fetal movement.   OB History    Gravida  2   Para      Term      Preterm      AB  1   Living        SAB      IAB      Ectopic  1   Multiple      Live Births              Past Medical History:  Diagnosis Date  . HSV (herpes simplex virus) infection     History reviewed. No pertinent surgical history.  History reviewed. No pertinent family history.  Social History   Tobacco Use  . Smoking status: Never Smoker  . Smokeless tobacco: Never Used  Vaping Use  . Vaping Use: Never used  Substance Use Topics  . Alcohol use: Never  . Drug use: Never    Allergies: No Known Allergies  No medications prior to admission.    Review of Systems  Constitutional: Negative.   Gastrointestinal: Positive for abdominal pain.  Musculoskeletal: Positive for back pain.   Physical Exam   Blood pressure 123/65, pulse 87, temperature 99.4 F (37.4 C), temperature source Oral, resp. rate 14, weight 94.3 kg, last menstrual period 08/17/2020, SpO2 99 %, unknown if currently breastfeeding.  Physical Exam Vitals and nursing note reviewed.  Constitutional:      General: She is not in acute  distress.    Appearance: Normal appearance.  HENT:     Head: Normocephalic and atraumatic.  Pulmonary:     Effort: Pulmonary effort is normal. No respiratory distress.  Musculoskeletal:        General: No swelling, tenderness or deformity. Normal range of motion.  Skin:    General: Skin is warm and dry.  Neurological:     Mental Status: She is alert.  Psychiatric:        Mood and Affect: Mood normal.        Behavior: Behavior normal.     NST:  Baseline: 150 bpm, Variability: Good {> 6 bpm), Accelerations: Reactive and Decelerations: Absent  MAU Course  Procedures Results for orders placed or performed during the hospital encounter of 03/21/21 (from the past 24 hour(s))  Urinalysis, Routine w reflex microscopic Urine, Clean Catch     Status: Abnormal   Collection Time: 03/21/21  5:28 PM  Result Value Ref Range   Color, Urine YELLOW YELLOW   APPearance HAZY (A) CLEAR   Specific Gravity, Urine 1.008 1.005 - 1.030   pH 7.0 5.0 - 8.0   Glucose, UA NEGATIVE NEGATIVE mg/dL   Hgb urine dipstick NEGATIVE NEGATIVE   Bilirubin Urine NEGATIVE NEGATIVE  Ketones, ur NEGATIVE NEGATIVE mg/dL   Protein, ur NEGATIVE NEGATIVE mg/dL   Nitrite NEGATIVE NEGATIVE   Leukocytes,Ua NEGATIVE NEGATIVE    MDM Category 1 fetal tracing with no regular contractions on toco. Patient reports contractions have improved since her last visit & occurs no more than 2 times per hour. Declines cervical exam during today's visit.   Tylenol given in MAU & ice applied to affected area. Patient reports improvement in symptoms.   Assessment and Plan   1. Pain of left lower extremity   2. [redacted] weeks gestation of pregnancy    -take tylenol prn. Return for worsening symptoms or if develop fever -reviewed PTL precautions & reasons to return to MAU -keep f/u appointment with ob on Thursday  Judeth Horn 03/21/2021, 7:45 PM

## 2021-03-21 NOTE — MAU Note (Signed)
Taylor [name] is a 25 y.o. at [redacted]w[redacted]d here in MAU reporting: pain on the left side from her injection yesterday, states it hurts all down her left leg. Having ongoing contractions but states they are better then previous MAU visit, having a few every hour. No bleeding or LOF. +FM  Onset of complaint: ongoing  Pain score: back/leg pain 8/10, contractions 5/10  Vitals:   03/21/21 1740  BP: 120/65  Pulse: 86  Resp: 16  Temp: 99.4 F (37.4 C)  SpO2: 98%     FHT:157  Lab orders placed from triage: UA

## 2021-03-21 NOTE — Discharge Instructions (Signed)
It was wonderful to see you! Glad your leg/bottom pain has gotten much better. You can continue to use tylenol 1000mg  3 times daily as needed in additional to ice 20 minutes at a time several times a day.   Please follow up if you notice any rash, redness, pus-like drainage, fever (>100.46F), leg weakness/numbness, or swelling in the area.   Additionally if you have any leakage of fluid, vaginal bleeding, or increasing severe painful contractions (>8 in one hour).     Childbirth Education Options: [facility] Department Classes:  Childbirth education classes can help you get ready for a positive parenting experience. You can also meet other expectant parents and get free stuff for your baby. Each class runs for five weeks on the same night and costs $45 for the mother-to-be and her support person. Medicaid covers the cost if you are eligible. Call [phone] to register. [facility] Childbirth Education:  [phone] or [phone] or [email or web address]  Baby & Me Class: Discuss newborn & infant parenting and family adjustment issues with other new mothers in a relaxed environment. Each week brings a new speaker or baby-centered activity. We encourage new mothers to join [phone] every Thursday at 11:00am. Babies birth until crawling. No registration or fee. Daddy Tuesday: This course offers Dads-to-be the tools and knowledge needed to feel confident on their journey to becoming new fathers. Experienced dads, who have been trained as coaches, teach dads-to-be how to hold, comfort, diaper, swaddle and play with their infant while being able to support the new mom as well. A class for men taught by men. $25/dad Big Brother/Big Sister: Let your children share in the joy of a new brother or sister in this special class designed just for them. Class includes discussion about how families care for babies: swaddling, holding, diapering, safety as well as how they can be helpful in  their new role. This class is designed for children ages 2 to 63, but any age is welcome. Please register each child individually. $5/child  Mom Talk: This mom-led group offers support and connection to mothers as they journey through the adjustments and struggles of that sometimes overwhelming first year after the birth of a child. Tuesdays at 10:00am and Thursdays at 6:00pm. Babies welcome. No registration or fee. Breastfeeding Support Group: This group is a mother-to-mother support circle where moms have the opportunity to share their breastfeeding experiences. A Lactation Consultant is present for questions and concerns. Meets each Tuesday at 11:00am. No fee or registration. Breastfeeding Your Baby: Learn what to expect in the first days of breastfeeding your newborn.  This class will help you feel more confident with the skills needed to begin your breastfeeding experience. Many new mothers are concerned about breastfeeding after leaving the hospital. This class will also address the most common fears and challenges about breastfeeding during the first few weeks, months and beyond. (call for fee) Comfort Techniques and Tour: This 2 hour interactive class will provide you the opportunity to learn & practice hands-on techniques that can help relieve some of the discomfort of labor and encourage your baby to rotate toward the best position for birth. You and your partner will be able to try a variety of labor positions with birth balls and rebozos as well as practice breathing, relaxation, and visualization techniques. A tour of the [facility] is included with this class. $20 per registrant and support person Childbirth Class- Weekend Option: This class is a Weekend version of our Birth &  Baby series. It is designed for parents who have a difficult time fitting several weeks of classes into their schedule. It covers the care of your newborn and the basics of labor and childbirth. It  also includes a Maternity Care Center Tour of [facility] and lunch. The class is held two consecutive days: beginning on Friday evening from 6:30 - 8:30 p.m. and the next day, Saturday from 9 a.m. - 4 p.m. (call for fee) Linden Dolin Class: Interested in a waterbirth?  This informational class will help you discover whether waterbirth is the right fit for you. Education about waterbirth itself, supplies you would need and how to assemble your support team is what you can expect from this class. Some obstetrical practices require this class in order to pursue a waterbirth. (Not all obstetrical practices offer waterbirth-check with your healthcare provider.) Register only the expectant mom, but you are encouraged to bring your partner to class! Required if planning waterbirth, no fee. Infant/Child CPR: Parents, grandparents, babysitters, and friends learn Cardio-Pulmonary Resuscitation skills for infants and children. You will also learn how to treat both conscious and unconscious choking in infants and children. This Family & Friends program does not offer certification. Register each participant individually to ensure that enough mannequins are available. (Call for fee) Grandparent Love: Expecting a grandbaby? This class is for you! Learn about the latest infant care and safety recommendations and ways to support your own child as he or she transitions into the parenting role. Taught by Registered Nurses who are childbirth instructors, but most importantly...they are grandmothers too! $10/person. Childbirth Class- Natural Childbirth: This series of 5 weekly classes is for expectant parents who want to learn and practice natural methods of coping with the process of labor and childbirth. Relaxation, breathing, massage, visualization, role of the partner, and helpful positioning are highlighted. Participants learn how to be confident in their body's ability to give birth. This class will empower and help  parents make informed decisions about their own care. Includes discussion that will help new parents transition into the immediate postpartum period. Maternity Care Center Tour of [facility] is included. We suggest taking this class between 25-32 weeks, but it's only a recommendation. $75 per registrant and one support person or $30 Medicaid. Childbirth Class- 3 week Series: This option of 3 weekly classes helps you and your labor partner prepare for childbirth. Newborn care, labor & birth, cesarean birth, pain management, and comfort techniques are discussed and a Maternity Care Center Tour of [facility] is included. The class meets at the same time, on the same day of the week for 3 consecutive weeks beginning with the starting date you choose. $60 for registrant and one support person.  Marvelous Multiples: Expecting twins, triplets, or more? This class covers the differences in labor, birth, parenting, and breastfeeding issues that face multiples' parents. NICU tour is included. Led by a Certified Childbirth Educator who is the mother of twins. No fee. Caring for Baby: This class is for expectant and adoptive parents who want to learn and practice the most up-to-date newborn care for their babies. Focus is on birth through the first six weeks of life. Topics include feeding, bathing, diapering, crying, umbilical cord care, circumcision care and safe sleep. Parents learn to recognize symptoms of illness and when to call the pediatrician. Register only the mom-to-be and your partner or support person can plan to come with you! $10 per registrant and support person Childbirth Class- online option: This online class offers you  the freedom to complete a Birth and Baby series in the comfort of your own home. The flexibility of this option allows you to review sections at your own pace, at times convenient to you and your support people. It includes additional video information, animations, quizzes, and  extended activities. Get organized with helpful eClass tools, checklists, and trackers. Once you register online for the class, you will receive an email within a few days to accept the invitation and begin the class when the time is right for you. The content will be available to you for 60 days. $60 for 60 days of online access for you and your support people.

## 2021-03-21 NOTE — MAU Provider Note (Signed)
Chief Complaint:  Back Pain and Contractions    HPI: Taylor [name] is a 25 y.o. G2P0010 at [redacted]w[redacted]d who presents to maternity admissions reporting back/leg pain and contractions.   Onset: Started this morning  Location: Lower left back/glutes into left thigh, in location of BMZ injection yesterday.  Quality: "Tight feeling, sharp"  Severity: 8/10 in pain scale, same severity since onset  Timing: Constant  Modifying factors: Laying on right side makes it feel a little better. More pressure on the area increases severity.  Associated signs and symptoms: No numbness, tingling, weakness, saddle anesthesia. No bladder/bowel incontinence or retention. No rash, erythema, or swelling. Has not interfered with her ability to do ADLs. Able to walk without concern or limp, no falls. No fever, fatigue, or chills.   She has not tried anything to make it better.   Contractions off and on since Saturday, less frequent compared to when evaluated previously. Pain 5/10 with contractions. Occurring every 20-30 minutes or so.  No vaginal bleeding or leakage of fluid.   Pregnancy Course:   Past Medical History:  Diagnosis Date  . HSV (herpes simplex virus) infection    OB History  Gravida Para Term Preterm AB Living  2       1    SAB IAB Ectopic Multiple Live Births      1        # Outcome Date GA Lbr Len/2nd Weight Sex Delivery Anes PTL Lv  2 Current           1 Ectopic 2021           History reviewed. No pertinent surgical history. History reviewed. No pertinent family history. Social History   Tobacco Use  . Smoking status: Never Smoker  . Smokeless tobacco: Never Used  Vaping Use  . Vaping Use: Never used  Substance Use Topics  . Alcohol use: Never  . Drug use: Never   No Known Allergies No medications prior to admission.    I have reviewed patient's Past Medical Hx, Surgical Hx, Family Hx, Social Hx, medications and allergies.   ROS:  Review of Systems  Constitutional: Negative for  chills, fatigue and fever.  Cardiovascular: Negative for leg swelling.  Gastrointestinal: Negative for abdominal pain.  Genitourinary: Negative for difficulty urinating and pelvic pain.  Musculoskeletal: Positive for back pain.  Skin: Negative for color change, rash and wound.    Physical Exam   Patient Vitals for the past 24 hrs:  BP Temp Temp src Pulse Resp SpO2 Weight  03/21/21 1905 123/65 -- -- 87 14 99 % --  03/21/21 1755 134/78 -- -- 91 -- -- --  03/21/21 1740 120/65 99.4 F (37.4 C) Oral 86 16 98 % --  03/21/21 1733 -- -- -- -- -- -- 94.3 kg    Constitutional: Well-developed, well-nourished female in no acute distress, laying comfortably.  Cardiovascular: normal rate, palpable dorsalis pedis pulses bilaterally  Respiratory: normal effort UK:GURKYH appropriate for gestational age.  Neurologic/MSK: Alert and oriented x 4. Generalized TTP around L4-5 transverse processes, flank region, entire left buttock, and upper lateral thigh. No spinous process tenderness. 5/5 lower extremity strength bilaterally. 2/4 patellar reflexes. Normal gait without limp. Sensation to light touch intact throughout bilateral lower extremity.  Derm: No erythema, warmth to touch, or swelling noted to left buttock. Bandaid residue seen on left upper buttock without rash. No area of fluctuance underneath skin.   Fetal Tracing: Baseline: 150bpm  Variability: Moderate  Accelerations: yes, 10x10, 15x15 present  Decelerations: None  Toco: None noted    Labs: Results for orders placed or performed during the hospital encounter of 03/21/21 (from the past 24 hour(s))  Urinalysis, Routine w reflex microscopic Urine, Clean Catch     Status: Abnormal   Collection Time: 03/21/21  5:28 PM  Result Value Ref Range   Color, Urine YELLOW YELLOW   APPearance HAZY (A) CLEAR   Specific Gravity, Urine 1.008 1.005 - 1.030   pH 7.0 5.0 - 8.0   Glucose, UA NEGATIVE NEGATIVE mg/dL   Hgb urine dipstick NEGATIVE NEGATIVE    Bilirubin Urine NEGATIVE NEGATIVE   Ketones, ur NEGATIVE NEGATIVE mg/dL   Protein, ur NEGATIVE NEGATIVE mg/dL   Nitrite NEGATIVE NEGATIVE   Leukocytes,Ua NEGATIVE NEGATIVE    Imaging:  No results found.  MAU Course/MDM: Orders Placed This Encounter  Procedures  . Urinalysis, Routine w reflex microscopic Urine, Clean Catch  . Apply ice to affected area  . Discharge patient Discharge disposition: 01-Home or Self Care; Discharge patient date: 03/21/2021   Meds ordered this encounter  Medications  . acetaminophen (TYLENOL) tablet 1,000 mg   1725: Rechecked patient. Leg/buttock pain is now 2/10, much improved. One contraction since evaluation, now 2/10 pain.   Assessment/plan: 1. Pain of left lower extremity   2. [redacted] weeks gestation of pregnancy    #Leg/buttock pain at recent BMZ injection site: Fortunately afebrile without skin changes or drainage to suggest concern for cellulitis or underlying infection. Neurologically intact without extremity weakness or numbness. Significant improvement following ice/tylenol, recommended continued use at home. Provided MAU precautions.   #Recent visit for threatened premature labor in 3rd trimester:  MAU visit 4/16 and 4/17 with positive fetal fibronectin, received BMZ x2. Reassuring reactive fetal strip today, minimal to no contractions without concerning sx. Offered cervical recheck given recent history, however patient declined.   Discharged home in stable condition.     Allergies as of 03/21/2021   No Known Allergies     Medication List    TAKE these medications   Blood Pressure Monitor Automat Devi 1 Device by Does not apply route daily. Automatic blood pressure cuff regular size. To monitor blood pressure regularly at home. ICD-10 code:Z34.90   Gojji Weight Scale Misc 1 Device by Does not apply route daily as needed. To weight self daily as needed at home. ICD-10 code: Z34.90   NIFEdipine 10 MG capsule Commonly known as:  Procardia Take 1 capsule (10 mg total) by mouth 3 (three) times daily.   prenatal multivitamin Tabs tablet Take 1 tablet by mouth daily at 12 noon.       Leticia Penna, DO Family Medicine PGY-3

## 2021-03-23 ENCOUNTER — Ambulatory Visit (INDEPENDENT_AMBULATORY_CARE_PROVIDER_SITE_OTHER): Payer: Medicaid Other | Admitting: Obstetrics and Gynecology

## 2021-03-23 ENCOUNTER — Encounter: Payer: Self-pay | Admitting: Obstetrics and Gynecology

## 2021-03-23 ENCOUNTER — Other Ambulatory Visit: Payer: Self-pay

## 2021-03-23 VITALS — BP 109/73 | HR 83 | Temp 98.0°F | Wt 206.4 lb

## 2021-03-23 DIAGNOSIS — Z3A31 31 weeks gestation of pregnancy: Secondary | ICD-10-CM

## 2021-03-23 DIAGNOSIS — O4703 False labor before 37 completed weeks of gestation, third trimester: Secondary | ICD-10-CM | POA: Insufficient documentation

## 2021-03-23 DIAGNOSIS — Z3480 Encounter for supervision of other normal pregnancy, unspecified trimester: Secondary | ICD-10-CM

## 2021-03-23 NOTE — Progress Notes (Signed)
   LOW-RISK PREGNANCY OFFICE VISIT Patient name: Taylor [name] MRN [id number]  Date of birth: 12-May-1996 Chief Complaint:   Routine Prenatal Visit  History of Present Illness:   Taylor [name] is a 25 y.o. G71P0010 female at [redacted]w[redacted]d with an Estimated Date of Delivery: 05/24/21 being seen today for ongoing management of a low-risk pregnancy.  Today she reports no complaints. She was seen She reports no contractions or any complaints like she had this past Saturday. Contractions: Not present. Vag. Bleeding: None.  Movement: Present. denies leaking of fluid. Review of Systems:   Pertinent items are noted in HPI Denies abnormal vaginal discharge w/ itching/odor/irritation, headaches, visual changes, shortness of breath, chest pain, abdominal pain, severe nausea/vomiting, or problems with urination or bowel movements unless otherwise stated above. Pertinent History Reviewed:  Reviewed past medical,surgical, social, obstetrical and family history.  Reviewed problem list, medications and allergies. Physical Assessment:   Vitals:   03/23/21 0949  BP: 109/73  Pulse: 83  Temp: 98 F (36.7 C)  Weight: 206 lb 6.4 oz (93.6 kg)  Body mass index is 29.62 kg/m.        Physical Examination:   General appearance: Well appearing, and in no distress  Mental status: Alert, oriented to person, place, and time  Skin: Warm & dry  Cardiovascular: Normal heart rate noted  Respiratory: Normal respiratory effort, no distress  Abdomen: Soft, gravid, nontender  Pelvic: Cervical exam deferred         Extremities: Edema: None  Fetal Status: Fetal Heart Rate (bpm): 146 Fundal Height: 30 cm Movement: Present    No results found for this or any previous visit (from the past 24 hour(s)).  Assessment & Plan:  1) Low-risk pregnancy G2P0010 at [redacted]w[redacted]d with an Estimated Date of Delivery: 05/24/21   2) Supervision of other normal pregnancy, antepartum  - [place] MFM OB COMP + 14 WK for growth - Anticipatory guidance for  virtual visit at 32 weeks  3) Threatened premature labor in third trimester  - [place] MFM OB COMP + 14 WK to check cervical length and presentation  4) [redacted] weeks gestation of pregnancy    Meds: No orders of the defined types were placed in this encounter.  Labs/procedures today: none  Plan:  Continue routine obstetrical care   Reviewed: Preterm labor symptoms and general obstetric precautions including but not limited to vaginal bleeding, contractions, leaking of fluid and fetal movement were reviewed in detail with the patient.  All questions were answered. Has home bp cuff. Check bp weekly, let us know if >140/90.   Follow-up: Return in about 2 weeks (around 04/06/2021) for Return OB - My Chart video.  Orders Placed This Encounter  Procedures  . [place] MFM OB COMP + 14 WK   Raelyn Mora MSN, CNM 03/23/2021 10:24 AM

## 2021-03-23 NOTE — Patient Instructions (Signed)
Go to MAU for > 6 painful contractions in 1 hour

## 2021-04-01 ENCOUNTER — Other Ambulatory Visit: Payer: Self-pay

## 2021-04-01 ENCOUNTER — Encounter (HOSPITAL_COMMUNITY): Payer: Self-pay | Admitting: Obstetrics and Gynecology

## 2021-04-01 ENCOUNTER — Inpatient Hospital Stay (HOSPITAL_COMMUNITY)
Admission: AD | Admit: 2021-04-01 | Discharge: 2021-04-01 | Disposition: A | Discharge status: Home / Self Care | Payer: Medicaid Other | Attending: Obstetrics and Gynecology | Admitting: Obstetrics and Gynecology

## 2021-04-01 ENCOUNTER — Inpatient Hospital Stay (HOSPITAL_BASED_OUTPATIENT_CLINIC_OR_DEPARTMENT_OTHER): Payer: Medicaid Other

## 2021-04-01 DIAGNOSIS — O2693 Pregnancy related conditions, unspecified, third trimester: Secondary | ICD-10-CM

## 2021-04-01 DIAGNOSIS — O368130 Decreased fetal movements, third trimester, not applicable or unspecified: Secondary | ICD-10-CM | POA: Diagnosis not present

## 2021-04-01 DIAGNOSIS — Z3A32 32 weeks gestation of pregnancy: Secondary | ICD-10-CM | POA: Diagnosis not present

## 2021-04-01 LAB — URINALYSIS, ROUTINE W REFLEX MICROSCOPIC
Bilirubin Urine: NEGATIVE
Glucose, UA: NEGATIVE mg/dL
Hgb urine dipstick: NEGATIVE
Ketones, ur: NEGATIVE mg/dL
Nitrite: NEGATIVE
Protein, ur: NEGATIVE mg/dL
Specific Gravity, Urine: 1.011 (ref 1.005–1.030)
pH: 9 — ABNORMAL HIGH (ref 5.0–8.0)

## 2021-04-01 NOTE — MAU Note (Signed)
Pt reports to mau with c/o pain in lower left abd that felt like "constant thumping", pt states shes unsure if its a fetal movement or a "heartbeat" but reports she has not felt the baby move since last night. FHR 150 in triage.  Denies LOF or vag bleeding.

## 2021-04-01 NOTE — MAU Provider Note (Signed)
History     CSN: [id number]  Arrival date and time: 04/01/21 2620   Event Date/Time   First Provider Initiated Contact with Patient 04/01/21 1041      Chief Complaint  Patient presents with  . Decreased Fetal Movement  . Abdominal Pain   HPI Taylor [name] is a 25 y.o. G2P0010 at [redacted]w[redacted]d who presents with decreased fetal movement & abdominal pain. Symptoms started 20 minutes prior to arrival to MAU. Reports intermittent pain that feels like contractions & occurs every 3 minutes. Rates pain 5/10. Denies n/v/d, dysuria, vaginal bleeding, or LOF. Also reports feeling no movement since last night prior to arrival.  Hasn't had anything to eat or drink today.   OB History    Gravida  2   Para      Term      Preterm      AB  1   Living        SAB      IAB      Ectopic  1   Multiple      Live Births              Past Medical History:  Diagnosis Date  . HSV (herpes simplex virus) infection     History reviewed. No pertinent surgical history.  History reviewed. No pertinent family history.  Social History   Tobacco Use  . Smoking status: Never Smoker  . Smokeless tobacco: Never Used  Vaping Use  . Vaping Use: Never used  Substance Use Topics  . Alcohol use: Never  . Drug use: Never    Allergies: No Known Allergies  Medications Prior to Admission  Medication Sig Dispense Refill Last Dose  . Blood Pressure Monitoring (BLOOD PRESSURE MONITOR AUTOMAT) DEVI 1 Device by Does not apply route daily. Automatic blood pressure cuff regular size. To monitor blood pressure regularly at home. ICD-10 code:Z34.90 1 each 0 03/31/2021 at Unknown time  . Misc. Devices (GOJJI WEIGHT SCALE) MISC 1 Device by Does not apply route daily as needed. To weight self daily as needed at home. ICD-10 code: Z34.90 1 each 0 03/31/2021 at Unknown time  . Prenatal Vit-Fe Fumarate-FA (PRENATAL MULTIVITAMIN) TABS tablet Take 1 tablet by mouth daily at 12 noon.   04/01/2021 at 0800  .  NIFEdipine (PROCARDIA) 10 MG capsule Take 1 capsule (10 mg total) by mouth 3 (three) times daily. (Patient not taking: Reported on 04/01/2021) 30 capsule 1 Not Taking at Unknown time    Review of Systems  Constitutional: Negative.   Gastrointestinal: Positive for abdominal pain.  Genitourinary: Negative.    Physical Exam   Blood pressure 113/65, pulse 99, temperature 98.4 F (36.9 C), temperature source Oral, resp. rate 16, height 5\' 10"  (1.778 m), weight 94.6 kg, last menstrual period 08/17/2020, SpO2 99 %, unknown if currently breastfeeding.  Physical Exam Vitals and nursing note reviewed. Exam conducted with a chaperone present.  Constitutional:      General: She is not in acute distress.    Appearance: She is well-developed and normal weight.  HENT:     Head: Normocephalic and atraumatic.  Pulmonary:     Effort: Pulmonary effort is normal. No respiratory distress.  Abdominal:     Palpations: Abdomen is soft.     Tenderness: There is no abdominal tenderness.  Genitourinary:    Comments: Dilation: Closed Effacement (%): Thick Cervical Position: Middle Station: Ballotable Exam by:: [phone] NP  Skin:    General: Skin is warm and dry.  Neurological:     Mental Status: She is alert.    NST:  Baseline: 145 bpm, Variability: Good {> 6 bpm), Accelerations: Reactive, Decelerations: Variable: mild and irregular uterine irritability  MAU Course  Procedures Results for orders placed or performed during the hospital encounter of 04/01/21 (from the past 24 hour(s))  Urinalysis, Routine w reflex microscopic Urine, Clean Catch     Status: Abnormal   Collection Time: 04/01/21 10:01 AM  Result Value Ref Range   Color, Urine YELLOW YELLOW   APPearance HAZY (A) CLEAR   Specific Gravity, Urine 1.011 1.005 - 1.030   pH 9.0 (H) 5.0 - 8.0   Glucose, UA NEGATIVE NEGATIVE mg/dL   Hgb urine dipstick NEGATIVE NEGATIVE   Bilirubin Urine NEGATIVE NEGATIVE   Ketones, ur NEGATIVE  NEGATIVE mg/dL   Protein, ur NEGATIVE NEGATIVE mg/dL   Nitrite NEGATIVE NEGATIVE   Leukocytes,Ua TRACE (A) NEGATIVE   RBC / HPF 0-5 0 - 5 RBC/hpf   WBC, UA 0-5 0 - 5 WBC/hpf   Bacteria, UA RARE (A) NONE SEEN   Squamous Epithelial / LPF 6-10 0 - 5   Mucus PRESENT     MDM Patient presents with contractions that started 20 minutes prior to arrival. Had a positive FFN 13 days ago & received betamethasone. Was prescribed procardia for her symptoms but hasn't been taking them. No regular contractions & cervix closed/thick. No apparent distress. Possibly braxton hicks vs irritability due to fasting status.   Report absent FM since last night prior to coming to MAU. While on monitor, documented over 20 movements in her first 30 minutes. Aside from mild variable deceleration, has a category 1 tracing. BPP 8/8 with normal AFI.   Assessment and Plan   1. Decreased fetal movement, third trimester, not applicable or unspecified fetus   2. [redacted] weeks gestation of pregnancy    -reviewed s/s PTL & reasons to return to MAU -reviewed fetal movement precautions  Judeth Horn 04/01/2021, 1:25 PM

## 2021-04-01 NOTE — Discharge Instructions (Signed)
Fetal Movement Counts Patient Name: ________________________________________________ Patient Due Date: ____________________  What is a fetal movement count? A fetal movement count is the number of times that you feel your baby move during a certain amount of time. This may also be called a fetal kick count. A fetal movement count is recommended for every pregnant woman. You may be asked to start counting fetal movements as early as week 28 of your pregnancy. Pay attention to when your baby is most active. You may notice your baby's sleep and wake cycles. You may also notice things that make your baby move more. You should do a fetal movement count:  When your baby is normally most active.  At the same time each day. A good time to count movements is while you are resting, after having something to eat and drink. How do I count fetal movements? 1. Find a quiet, comfortable area. Sit, or lie down on your side. 2. Write down the date, the start time and stop time, and the number of movements that you felt between those two times. Take this information with you to your health care visits. 3. Write down your start time when you feel the first movement. 4. Count kicks, flutters, swishes, rolls, and jabs. You should feel at least 10 movements. 5. You may stop counting after you have felt 10 movements, or if you have been counting for 2 hours. Write down the stop time. 6. If you do not feel 10 movements in 2 hours, contact your health care provider for further instructions. Your health care provider may want to do additional tests to assess your baby's well-being. Contact a health care provider if:  You feel fewer than 10 movements in 2 hours.  Your baby is not moving like he or she usually does. Date: ____________ Start time: ____________ Stop time: ____________ Movements: ____________ Date: ____________ Start time: ____________ Stop time: ____________ Movements: ____________ Date: ____________  Start time: ____________ Stop time: ____________ Movements: ____________ Date: ____________ Start time: ____________ Stop time: ____________ Movements: ____________ Date: ____________ Start time: ____________ Stop time: ____________ Movements: ____________ Date: ____________ Start time: ____________ Stop time: ____________ Movements: ____________ Date: ____________ Start time: ____________ Stop time: ____________ Movements: ____________ Date: ____________ Start time: ____________ Stop time: ____________ Movements: ____________ Date: ____________ Start time: ____________ Stop time: ____________ Movements: ____________ This information is not intended to replace advice given to you by your health care provider. Make sure you discuss any questions you have with your health care provider. Document Revised: 07/09/2019 Document Reviewed: 07/09/2019 Elsevier Patient Education  2021 Elsevier Inc.  

## 2021-04-06 ENCOUNTER — Encounter: Payer: Self-pay | Admitting: Obstetrics and Gynecology

## 2021-04-06 ENCOUNTER — Telehealth (INDEPENDENT_AMBULATORY_CARE_PROVIDER_SITE_OTHER): Payer: Medicaid Other | Admitting: Obstetrics and Gynecology

## 2021-04-06 VITALS — BP 128/81 | HR 101 | Wt 208.0 lb

## 2021-04-06 DIAGNOSIS — Z3480 Encounter for supervision of other normal pregnancy, unspecified trimester: Secondary | ICD-10-CM

## 2021-04-06 DIAGNOSIS — Z3483 Encounter for supervision of other normal pregnancy, third trimester: Secondary | ICD-10-CM | POA: Diagnosis not present

## 2021-04-06 DIAGNOSIS — Z3A33 33 weeks gestation of pregnancy: Secondary | ICD-10-CM

## 2021-04-06 NOTE — Patient Instructions (Signed)

## 2021-04-06 NOTE — Progress Notes (Signed)
MY CHART VIDEO VIRTUAL OBSTETRICS VISIT ENCOUNTER NOTE  I connected with Taylor [name] on 04/06/21 at  2:10 PM EDT by My Chart video at home and verified that I am speaking with the correct person using two identifiers. Provider located at [facility] for [employer] at [place].   I discussed the limitations, risks, security and privacy concerns of performing an evaluation and management service by My Chart video and the availability of in person appointments. I also discussed with the patient that there may be a patient responsible charge related to this service. The patient expressed understanding and agreed to proceed.  Subjective:  Taylor [name] is a 25 y.o. G2P0010 at [redacted]w[redacted]d being followed for ongoing prenatal care.  She is currently monitored for the following issues for this low-risk pregnancy and has Chlamydia infection; Supervision of other normal pregnancy, antepartum; and Threatened premature labor in third trimester on their problem list.  Patient reports no complaints. Reports fetal movement. Denies any contractions, bleeding or leaking of fluid.   The following portions of the patient's history were reviewed and updated as appropriate: allergies, current medications, past family history, past medical history, past social history, past surgical history and problem list.   Objective:   General:  Alert, oriented and cooperative.   Mental Status: Normal mood and affect perceived. Normal judgment and thought content.  Rest of physical exam deferred due to type of encounter  BP 128/81   Pulse (!) 101   Wt 208 lb (94.3 kg)   LMP 08/17/2020   BMI 29.84 kg/m  **Done by patient's own at home BP cuff and scale  Assessment and [name]:  Pregnancy: G2P0010 at [redacted]w[redacted]d  1. Supervision of other normal pregnancy, antepartum - Anticipatory guidance for GBS screening at 36 wks. Explained the test is important to be done at this time in pregnancy to ensure adequate treatment at the time  of delivery. Explained that a positive result does not mean any harm to her, but can be harmful to the baby. Meaning that if baby is exposed to the bacteria for too long without antibiotics, the bay has the potential to develop pneumonia, septicemia, or spinal meningitis and could end up in the NICU. Also, explained that a cervical exam may be performed at the time of testing to get a baseline cervical check and make sure there is no preterm cervical dilation.   2. [redacted] weeks gestation of pregnancy    Preterm labor symptoms and general obstetric precautions including but not limited to vaginal bleeding, contractions, leaking of fluid and fetal movement were reviewed in detail with the patient.  I discussed the assessment and treatment [name] with the patient. The patient was provided an opportunity to ask questions and all were answered. The patient agreed with the [name] and demonstrated an understanding of the instructions. The patient was advised to call back or seek an in-person office evaluation/go to MAU at [facility] for any urgent or concerning symptoms. Please refer to After Visit Summary for other counseling recommendations.   I provided 5 minutes of non-face-to-face time during this encounter. There was 5 minutes of chart review time spent prior to this encounter. Total time spent = 10 minutes.  Return in about 3 weeks (around 04/27/2021) for Return OB w/GBS.  Future Appointments  Date Time Provider Department Center  04/27/2021  9:30 AM Raelyn Mora, CNM [facility] None  05/02/2021  9:45 AM [facility]  05/04/2021  3:30 PM Raelyn Mora, CNM [facility] None  05/11/2021  3:10 PM Raelyn Mora, CNM [facility] None  05/18/2021  1:50 PM Raelyn Mora, CNM [facility] None  05/25/2021  1:10 PM Raelyn Mora, CNM [facility] None    Raelyn Mora, CNM Center for [employer], [facility]

## 2021-04-27 ENCOUNTER — Other Ambulatory Visit (HOSPITAL_COMMUNITY)
Admission: RE | Admit: 2021-04-27 | Discharge: 2021-04-27 | Disposition: A | Discharge status: Home / Self Care | Payer: Medicaid Other | Source: Ambulatory Visit | Attending: Obstetrics and Gynecology | Admitting: Obstetrics and Gynecology

## 2021-04-27 ENCOUNTER — Other Ambulatory Visit: Payer: Self-pay

## 2021-04-27 ENCOUNTER — Encounter: Payer: Self-pay | Admitting: Obstetrics and Gynecology

## 2021-04-27 ENCOUNTER — Ambulatory Visit (INDEPENDENT_AMBULATORY_CARE_PROVIDER_SITE_OTHER): Payer: Medicaid Other | Admitting: Obstetrics and Gynecology

## 2021-04-27 VITALS — BP 129/79 | HR 121 | Temp 98.5°F | Wt 213.2 lb

## 2021-04-27 DIAGNOSIS — Z3A36 36 weeks gestation of pregnancy: Secondary | ICD-10-CM | POA: Diagnosis present

## 2021-04-27 DIAGNOSIS — Z3480 Encounter for supervision of other normal pregnancy, unspecified trimester: Secondary | ICD-10-CM | POA: Insufficient documentation

## 2021-04-27 NOTE — Patient Instructions (Signed)
Braxton Hicks Contractions Contractions of the uterus can occur throughout pregnancy, but they are not always a sign that you are in labor. You may have practice contractions called Braxton Hicks contractions. These false labor contractions are sometimes confused with true labor. What are Taylor [name] contractions? Braxton Hicks contractions are tightening movements that occur in the muscles of the uterus before labor. Unlike true labor contractions, these contractions do not result in opening (dilation) and thinning of the cervix. Toward the end of pregnancy (32-34 weeks), Braxton Hicks contractions can happen more often and may become stronger. These contractions are sometimes difficult to tell apart from true labor because they can be very uncomfortable. You should not feel embarrassed if you go to the hospital with false labor. Sometimes, the only way to tell if you are in true labor is for your health care provider to look for changes in the cervix. The health care provider will do a physical exam and may monitor your contractions. If you are not in true labor, the exam should show that your cervix is not dilating and your water has not broken. If there are no other health problems associated with your pregnancy, it is completely safe for you to be sent home with false labor. You may continue to have Braxton Hicks contractions until you go into true labor. How to tell the difference between true labor and false labor True labor  Contractions last 30-70 seconds.  Contractions become very regular.  Discomfort is usually felt in the top of the uterus, and it spreads to the lower abdomen and low back.  Contractions do not go away with walking.  Contractions usually become more intense and increase in frequency.  The cervix dilates and gets thinner. False labor  Contractions are usually shorter and not as strong as true labor contractions.  Contractions are usually irregular.  Contractions  are often felt in the front of the lower abdomen and in the groin.  Contractions may go away when you walk around or change positions while lying down.  Contractions get weaker and are shorter-lasting as time goes on.  The cervix usually does not dilate or become thin. Follow these instructions at home:  Take over-the-counter and prescription medicines only as told by your health care provider.  Keep up with your usual exercises and follow other instructions from your health care provider.  Eat and drink lightly if you think you are going into labor.  If Braxton Hicks contractions are making you uncomfortable: ? Change your position from lying down or resting to walking, or change from walking to resting. ? Sit and rest in a tub of warm water. ? Drink enough fluid to keep your urine pale yellow. Dehydration may cause these contractions. ? Do slow and deep breathing several times an hour.  Keep all follow-up prenatal visits as told by your health care provider. This is important.   Contact a health care provider if:  You have a fever.  You have continuous pain in your abdomen. Get help right away if:  Your contractions become stronger, more regular, and closer together.  You have fluid leaking or gushing from your vagina.  You pass blood-tinged mucus (bloody show).  You have bleeding from your vagina.  You have low back pain that you never had before.  You feel your baby's head pushing down and causing pelvic pressure.  Your baby is not moving inside you as much as it used to. Summary  Contractions that occur before labor  are called Braxton Hicks contractions, false labor, or practice contractions.  Braxton Hicks contractions are usually shorter, weaker, farther apart, and less regular than true labor contractions. True labor contractions usually become progressively stronger and regular, and they become more frequent.  Manage discomfort from [facility] contractions  by changing position, resting in a warm bath, drinking plenty of water, or practicing deep breathing. This information is not intended to replace advice given to you by your health care provider. Make sure you discuss any questions you have with your health care provider. Document Revised: 11/01/2017 Document Reviewed: 04/04/2017 Elsevier Patient Education  2021 Elsevier Inc.   Signs and Symptoms of Labor Labor is the body's natural process of moving the baby and the placenta out of the uterus. The process of labor usually starts when the baby is full-term, between 23 and 40 weeks of pregnancy. Signs and symptoms that you are close to going into labor As your body prepares for labor and the birth of your baby, you may notice the following symptoms in the weeks and days before true labor starts:  Passing a small amount of thick, bloody mucus from your vagina. This is called normal bloody show or losing your mucus plug. This may happen more than a week before labor begins, or right before labor begins, as the opening of the cervix starts to widen (dilate). For some women, the entire mucus plug passes at once. For others, pieces of the mucus plug may gradually pass over several days.  Your baby moving (dropping) lower in your pelvis to get into position for birth (lightening). When this happens, you may feel more pressure on your bladder and pelvic bone and less pressure on your ribs. This may make it easier to breathe. It may also cause you to need to urinate more often and have problems with bowel movements.  Having "practice contractions," also called Braxton Hicks contractions or false labor. These occur at irregular (unevenly spaced) intervals that are more than 10 minutes apart. False labor contractions are common after exercise or sexual activity. They will stop if you change position, rest, or drink fluids. These contractions are usually mild and do not get stronger over time. They may feel  like: ? A backache or back pain. ? Mild cramps, similar to menstrual cramps. ? Tightening or pressure in your abdomen. Other early symptoms include:  Nausea or loss of appetite.  Diarrhea.  Having a sudden burst of energy, or feeling very tired.  Mood changes.  Having trouble sleeping.   Signs and symptoms that labor has begun Signs that you are in labor may include:  Having contractions that come at regular (evenly spaced) intervals and increase in intensity. This may feel like more intense tightening or pressure in your abdomen that moves to your back. ? Contractions may also feel like rhythmic pain in your upper thighs or back that comes and goes at regular intervals. ? For first-time mothers, this change in intensity of contractions often occurs at a more gradual pace. ? Women who have given birth before may notice a more rapid progression of contraction changes.  Feeling pressure in the vaginal area.  Your water breaking (rupture of membranes). This is when the sac of fluid that surrounds your baby breaks. Fluid leaking from your vagina may be clear or blood-tinged. Labor usually starts within 24 hours of your water breaking, but it may take longer to begin. ? Some women may feel a sudden gush of fluid. ? Others notice  that their underwear repeatedly becomes damp. Follow these instructions at home:  When labor starts, or if your water breaks, call your health care provider or nurse care line. Based on your situation, they will determine when you should go in for an exam.  During early labor, you may be able to rest and manage symptoms at home. Some strategies to try at home include: ? Breathing and relaxation techniques. ? Taking a warm bath or shower. ? Listening to music. ? Using a heating pad on the lower back for pain. If you are directed to use heat:  Place a towel between your skin and the heat source.  Leave the heat on for 20-30 minutes.  Remove the heat if your  skin turns bright red. This is especially important if you are unable to feel pain, heat, or cold. You may have a greater risk of getting burned.   Contact a health care provider if:  Your labor has started.  Your water breaks. Get help right away if:  You have painful, regular contractions that are 5 minutes apart or less.  Labor starts before you are [redacted] weeks along in your pregnancy.  You have a fever.  You have bright red blood coming from your vagina.  You do not feel your baby moving.  You have a severe headache with or without vision problems.  You have severe nausea, vomiting, or diarrhea.  You have chest pain or shortness of breath. These symptoms may represent a serious problem that is an emergency. Do not wait to see if the symptoms will go away. Get medical help right away. Call your local emergency services (911 in the [place] Do not drive yourself to the hospital. Summary  Labor is your body's natural process of moving your baby and the placenta out of your uterus.  The process of labor usually starts when your baby is full-term, between 10 and 40 weeks of pregnancy.  When labor starts, or if your water breaks, call your health care provider or nurse care line. Based on your situation, they will determine when you should go in for an exam. This information is not intended to replace advice given to you by your health care provider. Make sure you discuss any questions you have with your health care provider. Document Revised: 09/10/2020 Document Reviewed: 09/10/2020 Elsevier Patient Education  2021 [employer].

## 2021-04-27 NOTE — Progress Notes (Signed)
   LOW-RISK PREGNANCY OFFICE VISIT Patient name: Taylor [name] MRN [id number]  Date of birth: [birth year] Chief Complaint:   Routine Prenatal Visit  History of Present Illness:   Taylor [name] is a 25 y.o. G73P0010 female at [redacted]w[redacted]d with an Estimated Date of Delivery: 05/24/21 being seen today for ongoing management of a low-risk pregnancy.  Today she reports no complaints. Contractions: Not present. Vag. Bleeding: None.  Movement: Present. denies leaking of fluid. Review of Systems:   Pertinent items are noted in HPI Denies abnormal vaginal discharge w/ itching/odor/irritation, headaches, visual changes, shortness of breath, chest pain, abdominal pain, severe nausea/vomiting, or problems with urination or bowel movements unless otherwise stated above. Pertinent History Reviewed:  Reviewed past medical,surgical, social, obstetrical and family history.  Reviewed problem list, medications and allergies. Physical Assessment:   Vitals:   04/27/21 0933  BP: 129/79  Pulse: (!) 121  Temp: 98.5 F (36.9 C)  Weight: 213 lb 3.2 oz (96.7 kg)  Body mass index is 30.59 kg/m.        Physical Examination:   General appearance: Well appearing, and in no distress  Mental status: Alert, oriented to person, place, and time  Skin: Warm & dry  Cardiovascular: Normal heart rate noted  Respiratory: Normal respiratory effort, no distress  Abdomen: Soft, gravid, nontender  Pelvic: Cervical exam performed  Dilation: Closed Effacement (%): 50 Station: Ballotable  Extremities: Edema: None  Fetal Status: Fetal Heart Rate (bpm): 134 Fundal Height: 38 cm Movement: Present Presentation: Vertex  No results found for this or any previous visit (from the past 24 hour(s)).  Assessment & Plan:  1) Low-risk pregnancy G2P0010 at [redacted]w[redacted]d with an Estimated Date of Delivery: 05/24/21   2) Supervision of other normal pregnancy, antepartum - GBS culture - GC/CT - Results pending  - Discussed rotation of appts -  Information provided on signs of labor and BH ctxs   3) [redacted] weeks gestation of pregnancy   Meds: No orders of the defined types were placed in this encounter.  Labs/procedures today: GBS, GC/CT and cervical exam  Plan:  Continue routine obstetrical care   Reviewed: Preterm labor symptoms and general obstetric precautions including but not limited to vaginal bleeding, contractions, leaking of fluid and fetal movement were reviewed in detail with the patient.  All questions were answered. Has home bp cuff. Check bp weekly, let us know if >140/90.   Follow-up: Return in about 1 week (around 05/04/2021) for Return OB visit.  No orders of the defined types were placed in this encounter.  Raelyn Mora MSN, CNM 04/27/2021 9:50 AM

## 2021-04-28 LAB — CERVICOVAGINAL ANCILLARY ONLY
Chlamydia: NEGATIVE
Comment: NEGATIVE
Comment: NEGATIVE
Comment: NORMAL
Neisseria Gonorrhea: NEGATIVE
Trichomonas: NEGATIVE

## 2021-05-01 LAB — CULTURE, BETA STREP (GROUP B ONLY): Strep Gp B Culture: POSITIVE — AB

## 2021-05-02 ENCOUNTER — Other Ambulatory Visit: Payer: Self-pay | Admitting: Obstetrics and Gynecology

## 2021-05-02 ENCOUNTER — Ambulatory Visit: Payer: Medicaid Other | Attending: Obstetrics and Gynecology

## 2021-05-02 ENCOUNTER — Other Ambulatory Visit: Payer: Self-pay

## 2021-05-02 ENCOUNTER — Telehealth: Payer: Self-pay

## 2021-05-02 DIAGNOSIS — O99213 Obesity complicating pregnancy, third trimester: Secondary | ICD-10-CM | POA: Insufficient documentation

## 2021-05-02 DIAGNOSIS — Z3480 Encounter for supervision of other normal pregnancy, unspecified trimester: Secondary | ICD-10-CM

## 2021-05-02 DIAGNOSIS — O4703 False labor before 37 completed weeks of gestation, third trimester: Secondary | ICD-10-CM | POA: Diagnosis not present

## 2021-05-02 NOTE — Telephone Encounter (Signed)
mar/sw patient-advised may have one visitor 13+; no children allowed.

## 2021-05-04 ENCOUNTER — Encounter: Payer: Self-pay | Admitting: Obstetrics and Gynecology

## 2021-05-04 ENCOUNTER — Telehealth (INDEPENDENT_AMBULATORY_CARE_PROVIDER_SITE_OTHER): Payer: Medicaid Other | Admitting: Obstetrics and Gynecology

## 2021-05-04 ENCOUNTER — Other Ambulatory Visit: Payer: Self-pay

## 2021-05-04 DIAGNOSIS — Z3A37 37 weeks gestation of pregnancy: Secondary | ICD-10-CM | POA: Diagnosis not present

## 2021-05-04 DIAGNOSIS — Z3480 Encounter for supervision of other normal pregnancy, unspecified trimester: Secondary | ICD-10-CM

## 2021-05-04 DIAGNOSIS — Z3483 Encounter for supervision of other normal pregnancy, third trimester: Secondary | ICD-10-CM | POA: Diagnosis not present

## 2021-05-04 NOTE — Progress Notes (Signed)
   MY CHART VIDEO VIRTUAL OBSTETRICS VISIT ENCOUNTER NOTE  I connected with Taylor [name] on 05/04/21 at  3:30 PM EDT by My Chart video at home and verified that I am speaking with the correct person using two identifiers. Provider located at [facility] [employer] for [employer] at [place].   I discussed the limitations, risks, security and privacy concerns of performing an evaluation and management service by My Chart video and the availability of in person appointments. I also discussed with the patient that there may be a patient responsible charge related to this service. The patient expressed understanding and agreed to proceed.  I discussed the limitations of telemedicine and the availability of in person appointments.  Discussed there is a possibility of technology failure and discussed alternative modes of communication if that failure occurs.  Subjective:  Taylor [name] is a 25 y.o. G2P0010 at [redacted]w[redacted]d being followed for ongoing prenatal care.  She is currently monitored for the following issues for this low-risk pregnancy and has Chlamydia infection and Supervision of other normal pregnancy, antepartum on their problem list.  Patient reports occasional contractions. Reports fetal movement. Denies any contractions, bleeding or leaking of fluid.   The following portions of the patient's history were reviewed and updated as appropriate: allergies, current medications, past family history, past medical history, past social history, past surgical history and problem list.   Objective:   General:  Alert, oriented and cooperative.   Mental Status: Normal mood and affect perceived. Normal judgment and thought content.  Rest of physical exam deferred due to type of encounter  BP 127/80   Pulse 78   Wt 215 lb (97.5 kg)   LMP 08/17/2020   BMI 30.85 kg/m  **Done by patient's own at home BP cuff and scale  Assessment and [name]:  Pregnancy: G2P0010 at [redacted]w[redacted]d  1. Supervision of other normal  pregnancy, antepartum - Information provided on signs of labor   Term labor symptoms and general obstetric precautions including but not limited to vaginal bleeding, contractions, leaking of fluid and fetal movement were reviewed in detail with the patient.  I discussed the assessment and treatment [name] with the patient. The patient was provided an opportunity to ask questions and all were answered. The patient agreed with the [name] and demonstrated an understanding of the instructions. The patient was advised to call back or seek an in-person office evaluation/go to [facility] at [facility] for any urgent or concerning symptoms. Please refer to After Visit Summary for other counseling recommendations.   I provided 5 minutes of non-face-to-face time during this encounter. There was 5 minutes of chart review time spent prior to this encounter. Total time spent = 10 minutes.  Return in about 1 week (around 05/11/2021) for Return OB visit.  Future Appointments  Date Time Provider Department Center  05/11/2021  3:10 PM Raelyn Mora, CNM [facility] None  05/18/2021  1:50 PM Raelyn Mora, CNM [facility] None  05/25/2021  1:10 PM Raelyn Mora, CNM [facility] None    Raelyn Mora, CNM Center for [employer], [facility]

## 2021-05-04 NOTE — Patient Instructions (Signed)

## 2021-05-11 ENCOUNTER — Encounter: Payer: Medicaid Other | Admitting: Obstetrics and Gynecology

## 2021-05-18 ENCOUNTER — Encounter: Payer: Medicaid Other | Admitting: Obstetrics and Gynecology

## 2021-05-24 ENCOUNTER — Inpatient Hospital Stay (HOSPITAL_COMMUNITY): Admit: 2021-05-24 | Payer: Self-pay

## 2021-05-25 ENCOUNTER — Encounter: Payer: Medicaid Other | Admitting: Obstetrics and Gynecology

## 2022-05-13 IMAGING — US US OB < 14 WEEKS - US OB TV
1 series · 15 of 28 positions shown · non-contrast
Comparison: None.

CLINICAL DATA: Vaginal bleeding

EXAM:
OBSTETRIC <14 WK US AND TRANSVAGINAL OB US
TECHNIQUE: Both transabdominal and transvaginal ultrasound examinations were
performed for complete evaluation of the gestation as well as the
maternal uterus, adnexal regions, and pelvic cul-de-sac.
Transvaginal technique was performed to assess early pregnancy.

[Series 1: us ob < 14 weeks - us ob tv · 32 acquisitions, 15 frames shown]
[im 1/32]
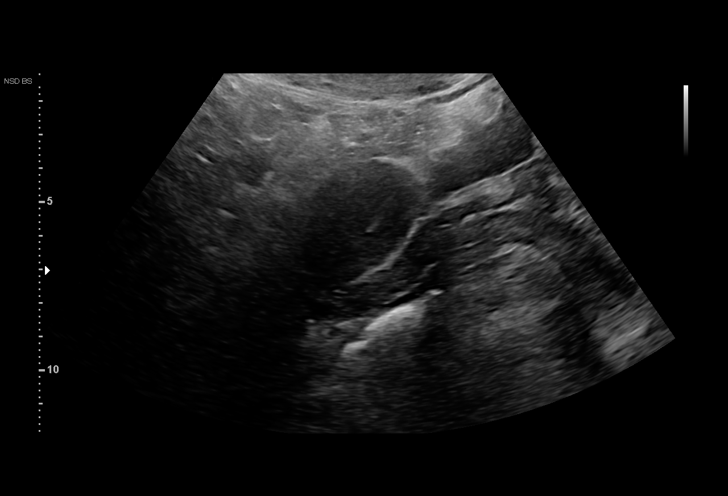
[im 3/32]
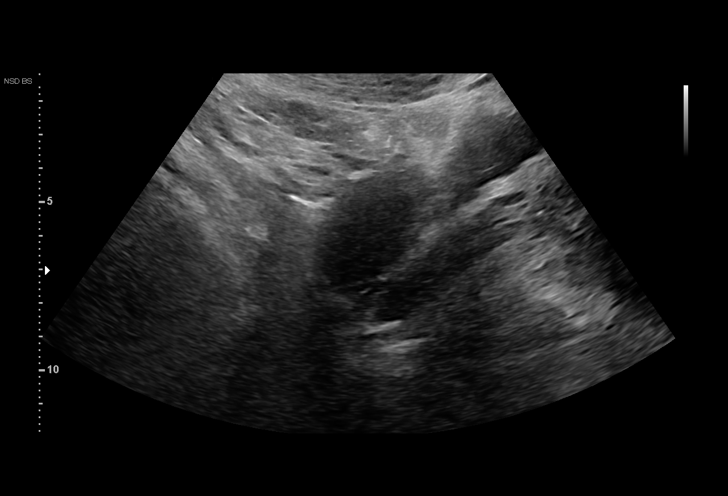
[im 5/32]
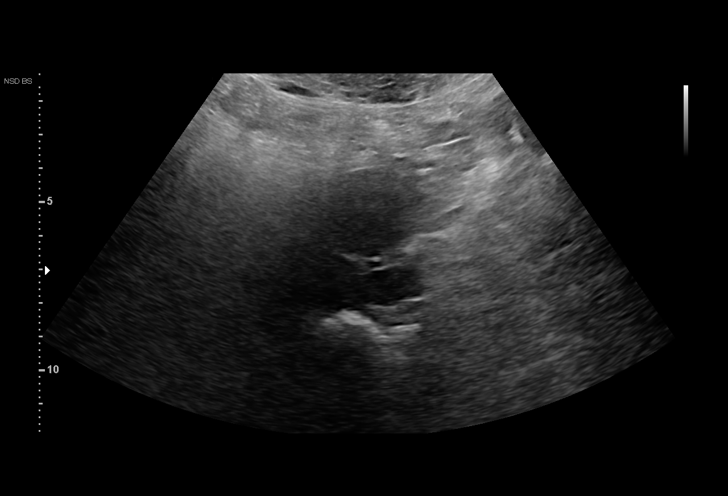
[im 7/32]
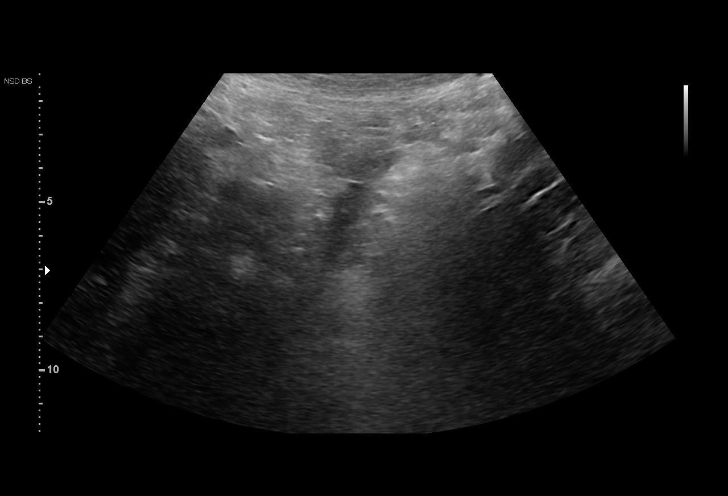
[im 10/32]
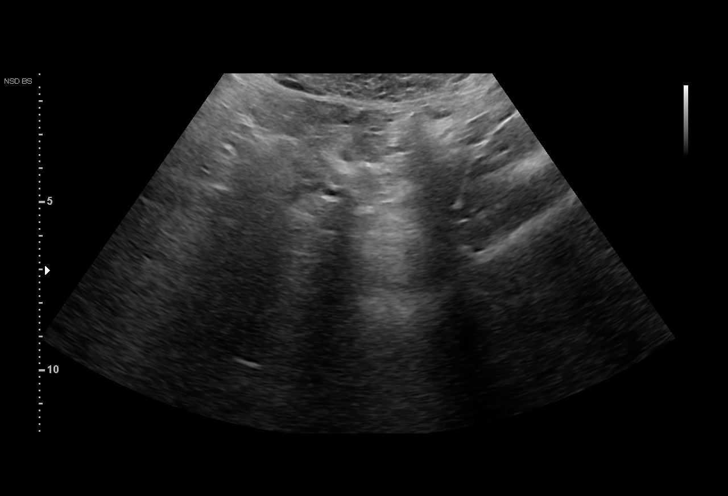
[im 12/32]
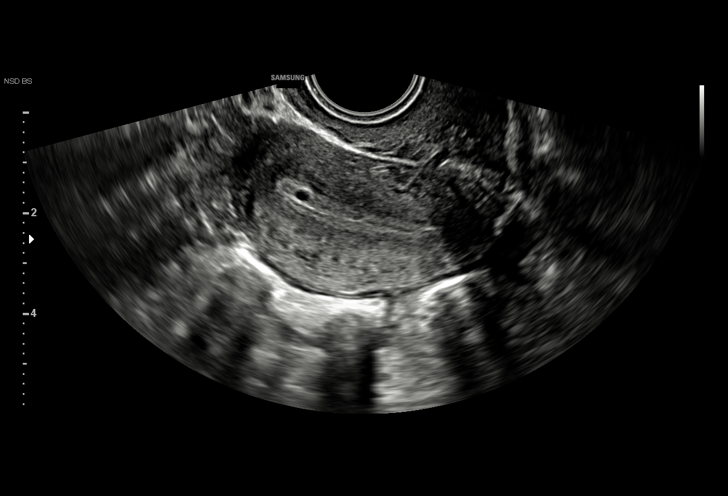
[im 14/32]
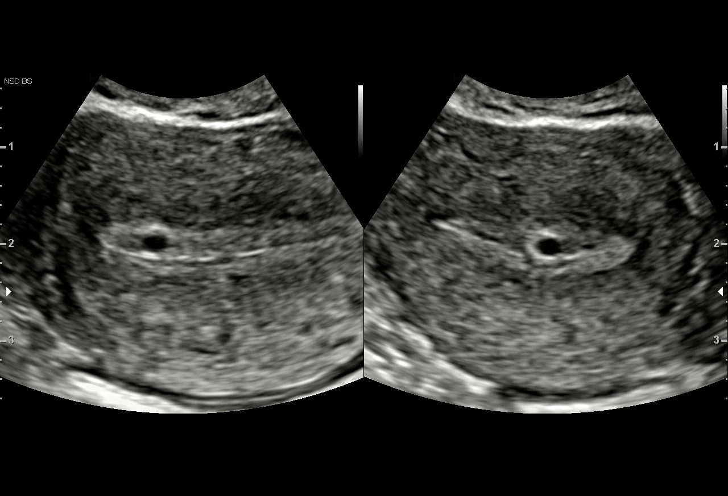
[im 17/32]
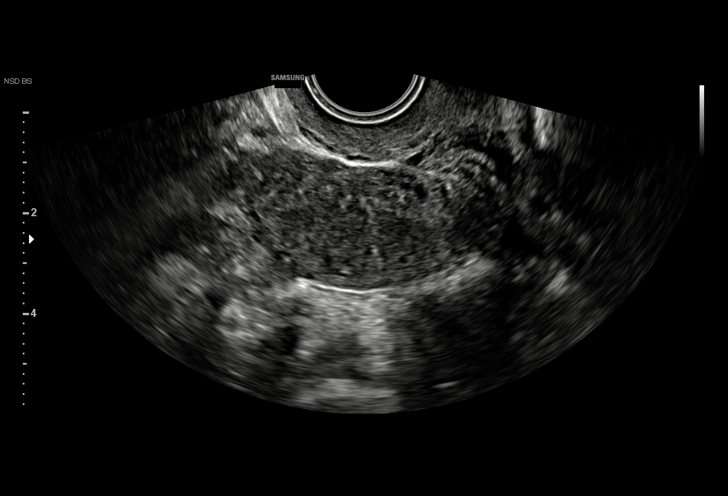
[im 18/32]
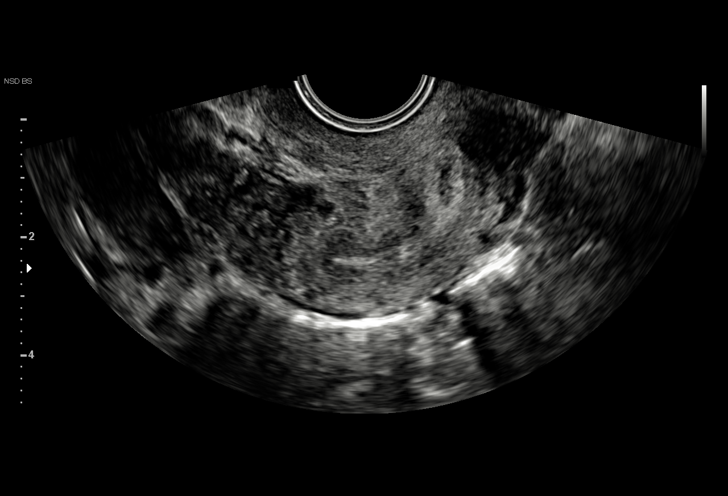
[im 20/32]
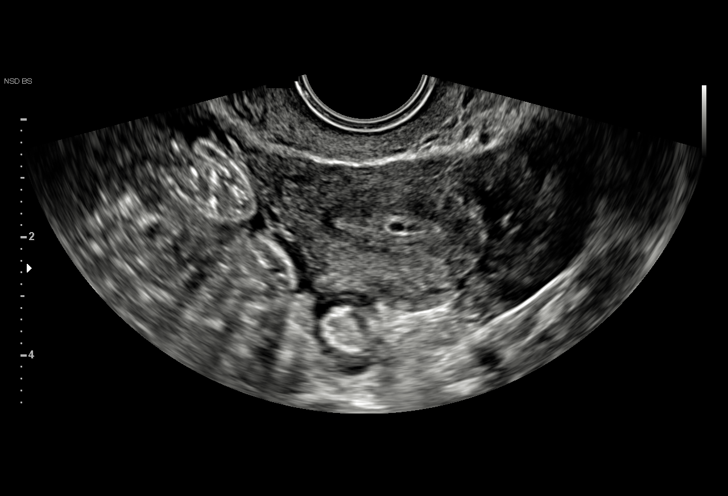
[im 22/32]
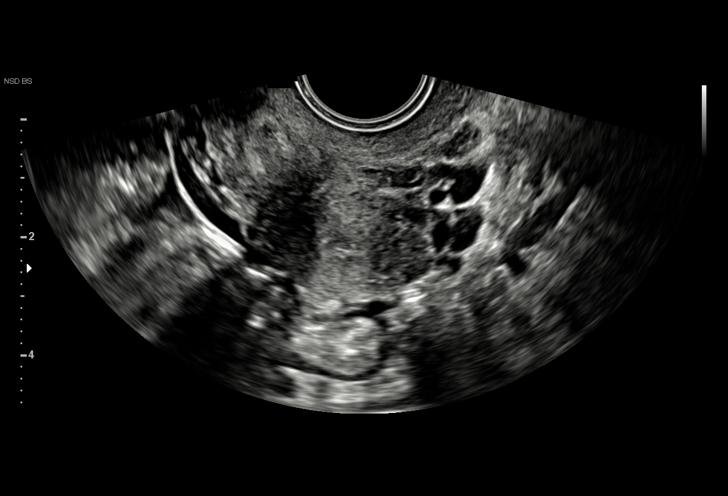
[im 25/32]
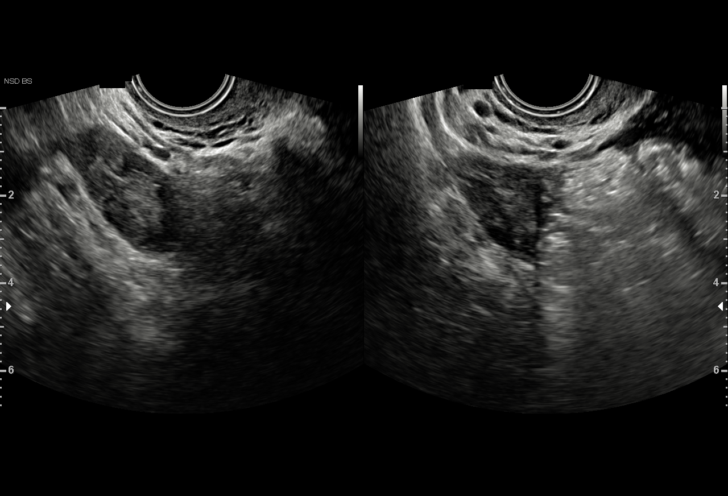
[im 27/32]
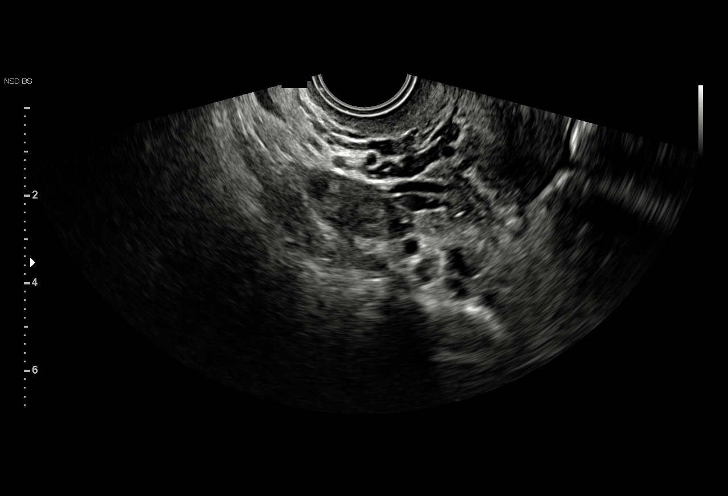
[im 29/32]
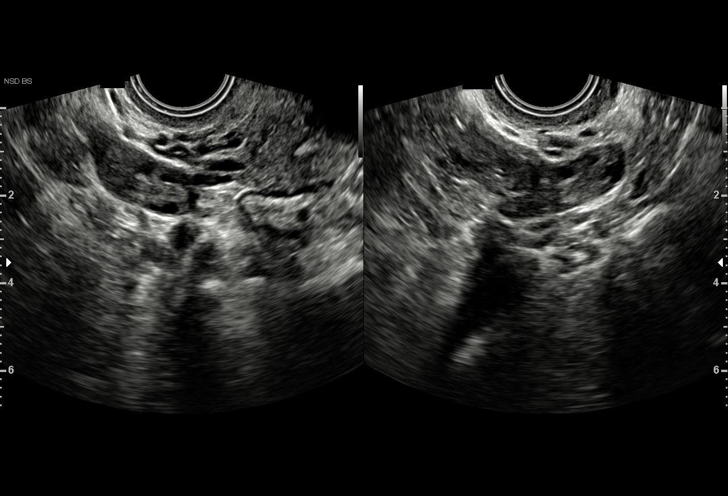
[im 32/32]
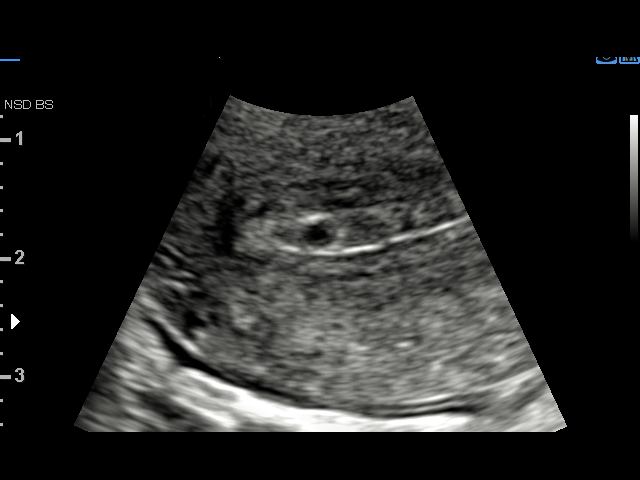

[15 of 28 positions shown; findings below may reference images not displayed]

FINDINGS: Intrauterine gestational sac: Single

Yolk sac:  Absent

Embryo:  Absent

MSD: 2.3 mm mm   4 w   6 d

Subchorionic hemorrhage:  None visualized.

Maternal uterus/adnexae: Within normal limits.
IMPRESSION: Probable early intrauterine gestational sac, but no yolk sac, fetal
pole, or cardiac activity yet visualized. Recommend follow-up
quantitative B-HCG levels and follow-up US in 14 days to assess
viability. This recommendation follows SRU consensus guidelines:
Diagnostic Criteria for Nonviable Pregnancy Early in the First
Trimester. N Engl J Med 8417; [DATE].

## 2022-05-18 IMAGING — US US OB TRANSVAGINAL
1 series · 15 of 28 positions shown · non-contrast
Comparison: 04/16/2020

CLINICAL DATA: Pregnancy of unknown location. No pain or vaginal
bleeding. Quantitative beta HCG 229 today ([DATE],
[DATE]). Estimated gestational age per LMP 6 weeks 4 days.

EXAM:
TRANSVAGINAL OB ULTRASOUND
TECHNIQUE: Transvaginal ultrasound was performed for complete evaluation of the
gestation as well as the maternal uterus, adnexal regions, and
pelvic cul-de-sac.

[Series 1: us ob transvaginal · 15 of 67 slices shown]
[im 1/67]
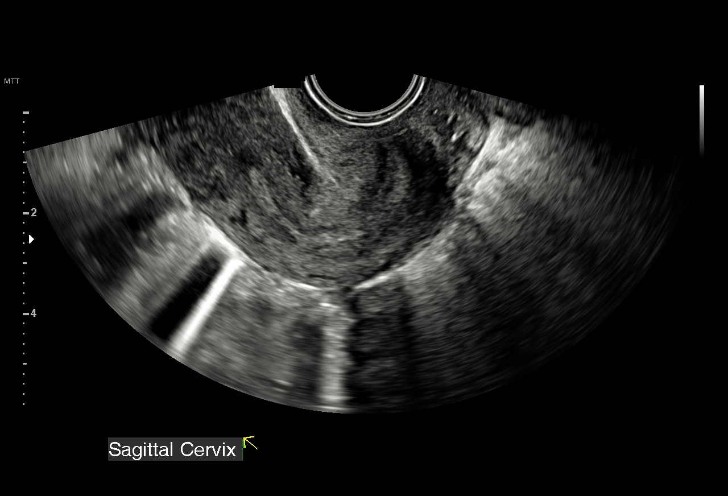
[im 5/67]
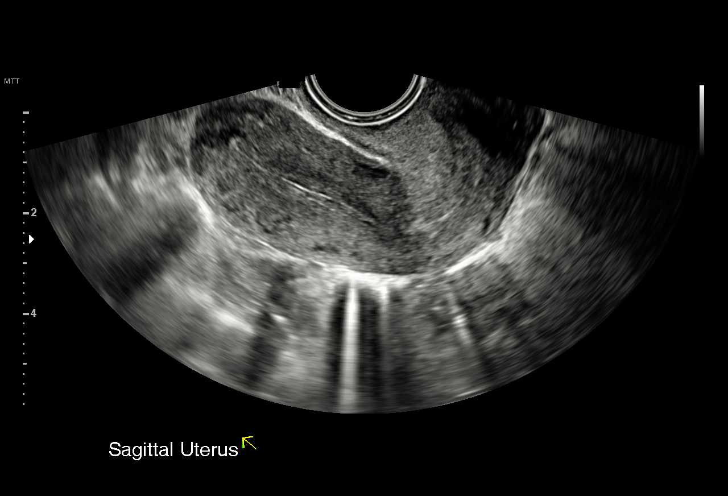
[im 10/67]
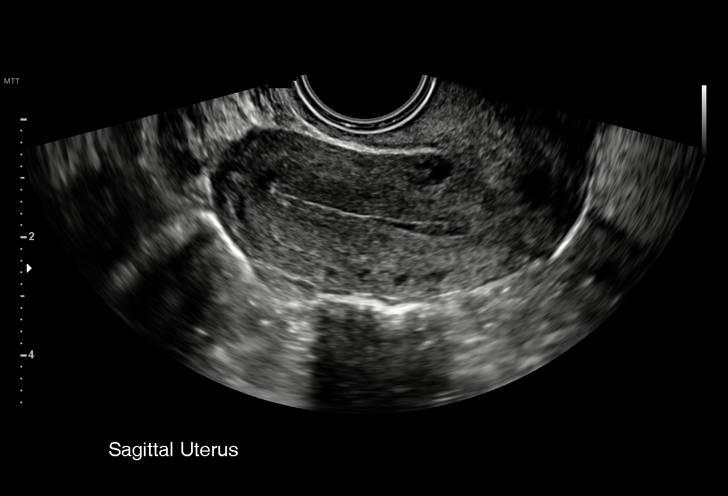
[im 15/67]
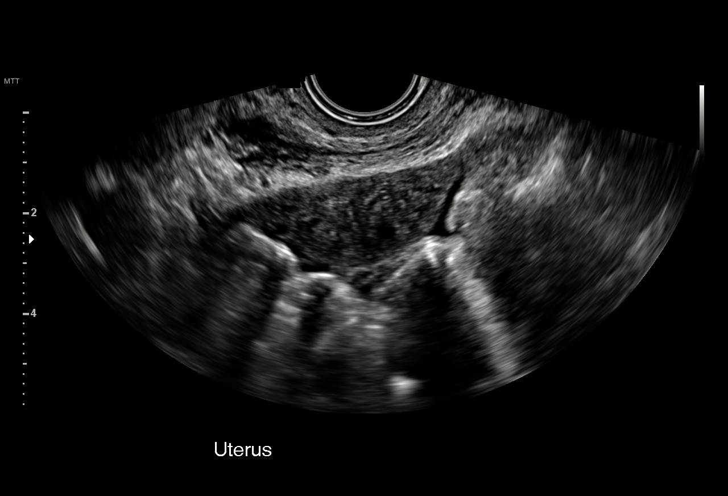
[im 20/67]
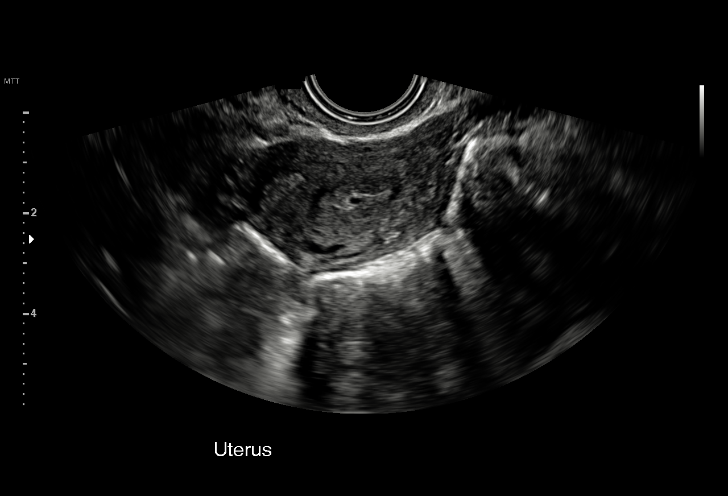
[im 25/67]
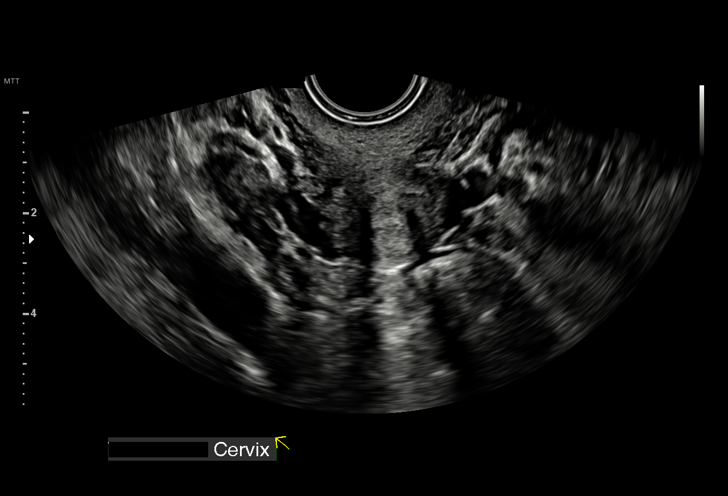
[im 30/67]
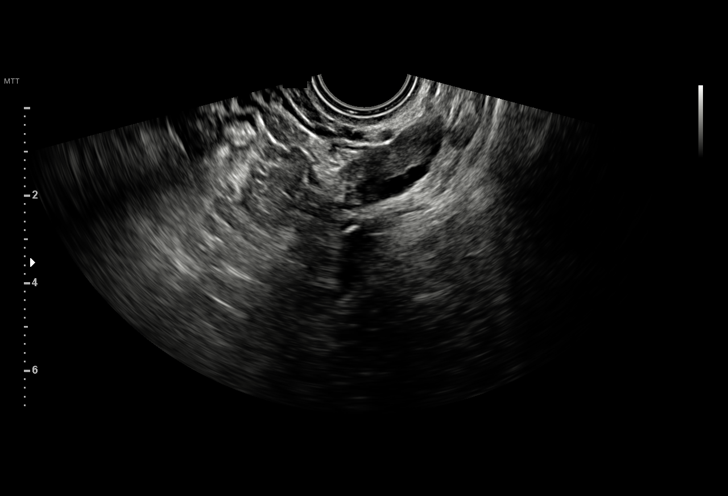
[im 35/67]
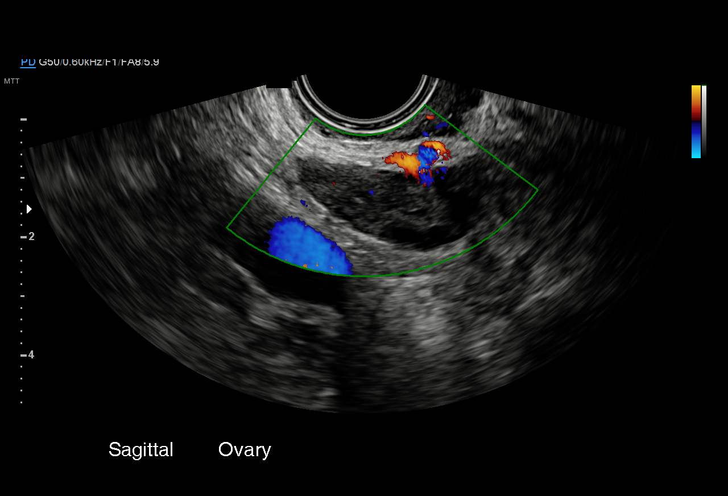
[im 37/67]
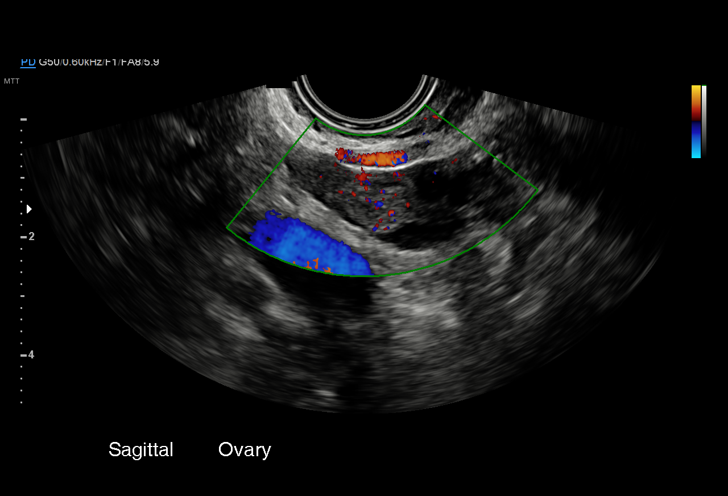
[im 42/67]
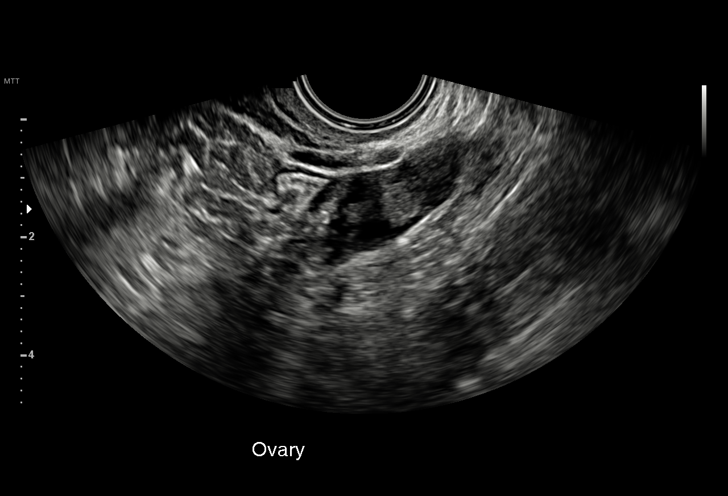
[im 47/67]
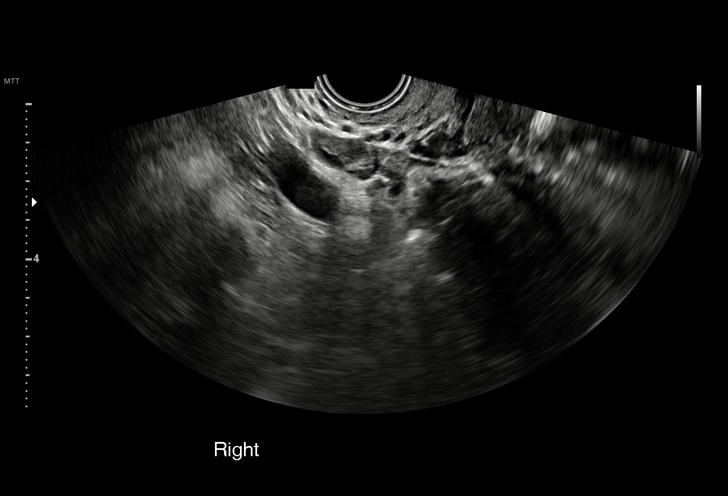
[im 52/67]
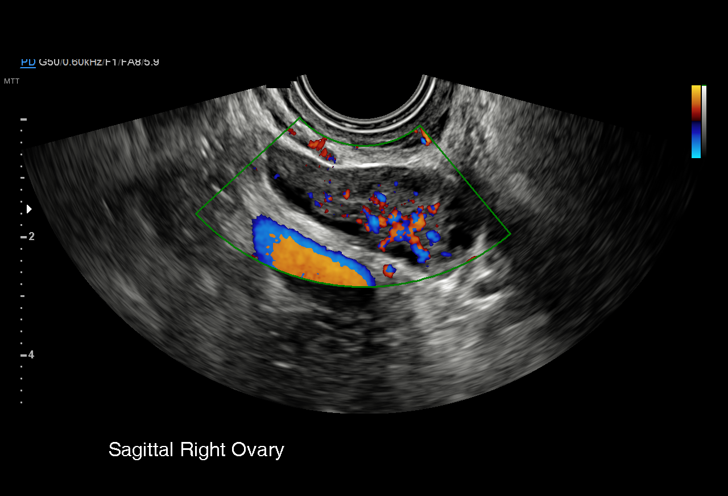
[im 57/67]
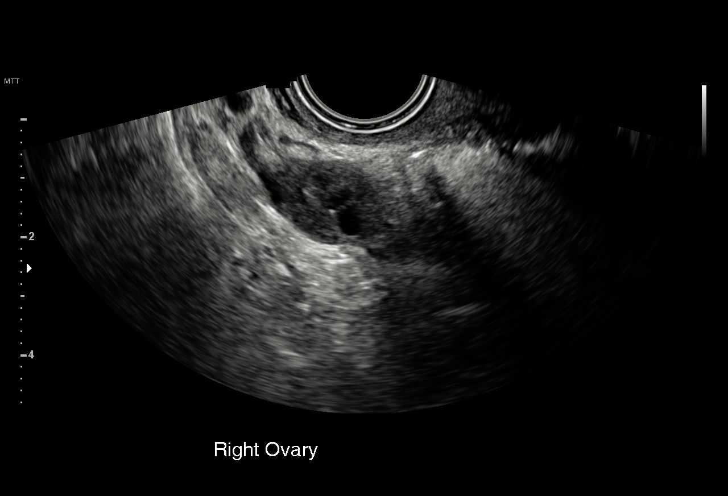
[im 62/67]
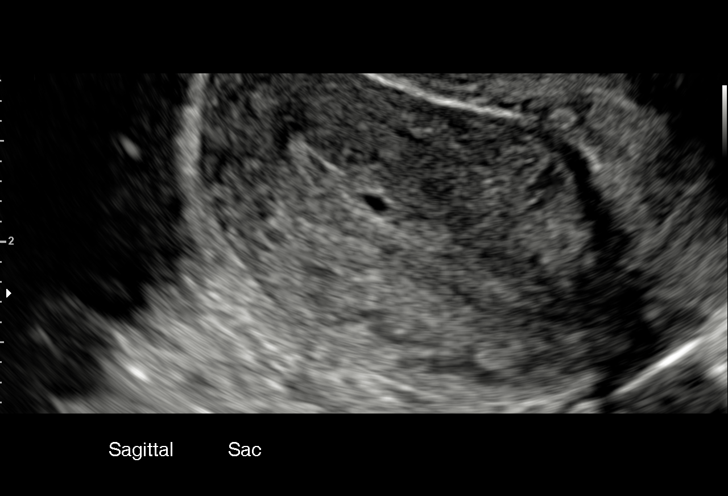
[im 67/67]
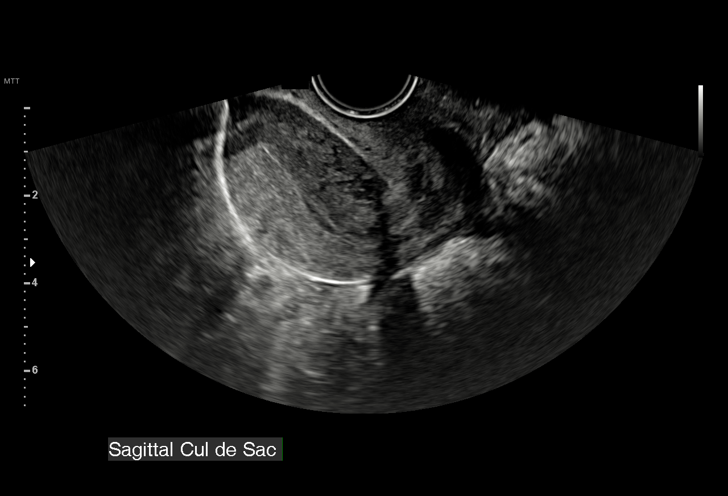

[15 of 28 positions shown; findings below may reference images not displayed]

FINDINGS: Intrauterine gestational sac: Single oval cystic structure over the
endometrium unchanged and may represent a gestational sac.

Yolk sac:  Not visualized.

Embryo:  Not visualized.

Cardiac Activity: Not visualized.

Heart Rate: Not visualized.  Bpm

MSD: 2.3 mm   4 w   6 d

Subchorionic hemorrhage:  None visualized.

Maternal uterus/adnexae: Ovaries are normal size, shape and position
with normal color Doppler bilaterally. No extra ovarian abnormality
within the adnexa. No free fluid.
IMPRESSION: 1. Possible intrauterine gestational sac with mean sac diameter
compatible with estimated gestational age 4 weeks 6 days which is
unchanged. No other findings to suggest ectopic pregnancy. Given
clinical findings, this likely represents failed pregnancy, although
normal early pregnancy is still possible. Consider continued serial
quantitative beta HCG and follow-up ultrasound 7-10 days.

## 2023-05-17 ENCOUNTER — Telehealth: Payer: 59 | Admitting: Physician Assistant

## 2023-05-17 ENCOUNTER — Telehealth: Payer: Medicaid Other | Admitting: Physician Assistant

## 2023-05-17 DIAGNOSIS — A749 Chlamydial infection, unspecified: Secondary | ICD-10-CM | POA: Diagnosis not present

## 2023-05-17 DIAGNOSIS — Z202 Contact with and (suspected) exposure to infections with a predominantly sexual mode of transmission: Secondary | ICD-10-CM

## 2023-05-17 MED ORDER — DOXYCYCLINE HYCLATE 100 MG PO TABS: 100.0000 mg | ORAL_TABLET | Freq: Two times a day (BID) | ORAL | 0 refills | Status: AC

## 2023-05-17 NOTE — Progress Notes (Signed)
Duplicate. Video scheduled

## 2023-05-17 NOTE — Patient Instructions (Signed)
Maia Plan, thank you for joining Margaretann Loveless, PA-C for today's virtual visit.  While this provider is not your primary care provider (PCP), if your PCP is located in our provider database this encounter information will be shared with them immediately following your visit.   A [facility] MyChart account gives you access to today's visit and all your visits, tests, and labs performed at [facility] " click here if you don't have a [facility] MyChart account or go to [email or web address]  Consent: (Patient) Taylor [name] provided verbal consent for this virtual visit at the beginning of the encounter.  Current Medications:  Current Outpatient Medications:    doxycycline (VIBRA-TABS) 100 MG tablet, Take 1 tablet (100 mg total) by mouth 2 (two) times daily., Disp: 14 tablet, Rfl: 0   Blood Pressure Monitoring (BLOOD PRESSURE MONITOR AUTOMAT) DEVI, 1 Device by Does not apply route daily. Automatic blood pressure cuff regular size. To monitor blood pressure regularly at home. ICD-10 code:Z34.90, Disp: 1 each, Rfl: 0   Misc. Devices (GOJJI WEIGHT SCALE) MISC, 1 Device by Does not apply route daily as needed. To weight self daily as needed at home. ICD-10 code: Z34.90, Disp: 1 each, Rfl: 0   NIFEdipine (PROCARDIA) 10 MG capsule, Take 1 capsule (10 mg total) by mouth 3 (three) times daily. (Patient not taking: No sig reported), Disp: 30 capsule, Rfl: 1   Prenatal Vit-Fe Fumarate-FA (PRENATAL MULTIVITAMIN) TABS tablet, Take 1 tablet by mouth daily at 12 noon., Disp: , Rfl:    Medications ordered in this encounter:  Meds ordered this encounter  Medications   doxycycline (VIBRA-TABS) 100 MG tablet    Sig: Take 1 tablet (100 mg total) by mouth 2 (two) times daily.    Dispense:  14 tablet    Refill:  0    Order Specific Question:   Supervising Provider    Answer:   Merrilee Jansky [id number]     *If you need refills on other medications prior to your next appointment,  please contact your pharmacy*  Follow-Up: Call back or seek an in-person evaluation if the symptoms worsen or if the condition fails to improve as anticipated.  [facility] [phone]  Other Instructions Chlamydia, Female  Chlamydia is a sexually transmitted infection (STI). This infection spreads through sexual contact. Chlamydia can occur in different areas of the body, including: The urethra. This is the part of the body that drains urine from the bladder. The cervix. This is the lowest part of the uterus. The throat. The rectum. This condition is not difficult to treat. However, if left untreated, chlamydia can lead to more serious health problems, including pelvic inflammatory disease (PID). PID can increase your risk of being unable to have children. In pregnant women, untreated chlamydia can cause serious complications during pregnancy or health problems for the baby after delivery. What are the causes? This condition is caused by a bacteria called Chlamydia trachomatis. The bacteria are spread from an infected partner during sexual activity. Chlamydia can spread through contact with the genitals, mouth, or rectum. What increases the risk? The following factors may make you more likely to develop this condition: Not using a condom the right way or not using a condom every time you have sex. Having a new sex partner or having more than one sex partner. Being sexually active before age 9. What are the signs or symptoms? In some cases, there are no symptoms, especially early in the infection. If symptoms develop,  they may include: Urinating often, or a burning feeling during urination. Redness, soreness, or swelling of the vagina or rectum. Discharge coming from the vagina or rectum. Pain in the abdomen. Pain during sex. Bleeding between menstrual periods or irregular periods. How is this diagnosed? This condition may be diagnosed with: Urine tests. Swab  tests. Depending on your symptoms, your health care provider may use a cotton swab to collect a fluid sample from your vagina, rectum, nose, or throat to test for the bacteria. A pelvic exam. How is this treated? This condition is treated with antibiotic medicines. Follow these instructions at home: Sexual activity Tell your sex partner or partners about your infection. These include any partners for oral, anal, or vaginal sex that you have had within 60 days of when your symptoms started. Sex partners should also be treated, even if they have no signs of the infection. Do not have sex until you and your sex partners have completed treatment and your health care provider says it is okay. If your health care provider prescribed you a single-dose medicine as treatment, wait at least 7 days after taking the medicine before having sex. General instructions Take over-the-counter and prescription medicines as told by your health care provider. Finish all antibiotic medicine even when you start to feel better. It is up to you to get your test results. Ask your health care provider, or the department that is doing the test, when your results will be ready. Keep all follow-up visits. This is important. You may need to be tested for infection again 3 months after treatment. How is this prevented? You can lower your risk of getting chlamydia by: Using latex or polyurethane condoms correctly every time you have sex. Not having multiple sex partners. Asking if your sex partner has been tested for STIs and had negative results. Getting regular health screenings to check for STIs. Contact a health care provider if: You develop new symptoms or your symptoms are getting worse. Your symptoms do not get better after treatment. You have a fever or chills. You have pain during sex. You have irregular menstrual periods, or you have bleeding between periods or after sex. You develop flu-like symptoms, such as night  sweats, sore throat, or muscle aches. You are unable to take your antibiotic medicine as prescribed. Summary Chlamydia is a sexually transmitted infection (STI) that is caused by bacteria. This infection spreads through sexual contact. This condition is treated with antibiotic medicines. If left untreated, chlamydia can lead to more serious health problems, including pelvic inflammatory disease (PID). Your sex partners will also need to be treated. Do not have sex until both you and your partner have been treated. Take medicines as directed by your health care provider and keep all follow-up visits to ensure your infection has been completely treated. This information is not intended to replace advice given to you by your health care provider. Make sure you discuss any questions you have with your health care provider. Document Revised: 09/11/2021 Document Reviewed: 09/11/2021 Elsevier Patient Education  2024 Elsevier Inc.    If you have been instructed to have an in-person evaluation today at a local Urgent Care facility, please use the link below. It will take you to a list of all of our available [facility], including address, phone number and hours of operation. Please do not delay care.  [facility]  If you or a family member do not have a primary care provider, use the  link below to schedule a visit and establish care. When you choose a [facility] primary care physician or advanced practice provider, you gain a long-term partner in health. Find a Primary Care Provider  Learn more about [facility]'s in-office and virtual care options: [facility]

## 2023-05-17 NOTE — Progress Notes (Signed)
Virtual Visit Consent   Taylor [name], you are scheduled for a virtual visit with a [facility] provider today. Just as with appointments in the office, your consent must be obtained to participate. Your consent will be active for this visit and any virtual visit you may have with one of our providers in the next 365 days. If you have a MyChart account, a copy of this consent can be sent to you electronically.  As this is a virtual visit, video technology does not allow for your provider to perform a traditional examination. This may limit your provider's ability to fully assess your condition. If your provider identifies any concerns that need to be evaluated in person or the need to arrange testing (such as labs, EKG, etc.), we will make arrangements to do so. Although advances in technology are sophisticated, we cannot ensure that it will always work on either your end or our end. If the connection with a video visit is poor, the visit may have to be switched to a telephone visit. With either a video or telephone visit, we are not always able to ensure that we have a secure connection.  By engaging in this virtual visit, you consent to the provision of healthcare and authorize for your insurance to be billed (if applicable) for the services provided during this visit. Depending on your insurance coverage, you may receive a charge related to this service.  I need to obtain your verbal consent now. Are you willing to proceed with your visit today? Taylor [name] has provided verbal consent on 05/17/2023 for a virtual visit (video or telephone). Margaretann Loveless, PA-C  Date: 05/17/2023 10:53 AM  Virtual Visit via Video Note   I, Margaretann Loveless, connected with  Taylor [name]  ([id number], 05-31-1996) on 05/17/23 at 11:00 AM EDT by a video-enabled telemedicine application and verified that I am speaking with the correct person using two identifiers.  Location: Patient: Virtual Visit Location  Patient: Home Provider: Virtual Visit Location Provider: Home Office   I discussed the limitations of evaluation and management by telemedicine and the availability of in person appointments. The patient expressed understanding and agreed to proceed.    History of Present Illness: Taylor [name] is a 27 y.o. who identifies as a female who was assigned female at birth, and is being seen today for STI.  Was seen at a local [facility] and was tested through [facility]. Her results were positive for Chlamydia. Negative for Gonorrhea, BV, Trich, Yeast. These results are not visible at this time in Epic or Care Everywhere.    Problems:  Patient Active Problem List   Diagnosis Date Noted   Supervision of other normal pregnancy, antepartum 02/28/2021   Chlamydia infection 04/19/2020    Allergies: No Known Allergies Medications:  Current Outpatient Medications:    doxycycline (VIBRA-TABS) 100 MG tablet, Take 1 tablet (100 mg total) by mouth 2 (two) times daily., Disp: 14 tablet, Rfl: 0   Blood Pressure Monitoring (BLOOD PRESSURE MONITOR AUTOMAT) DEVI, 1 Device by Does not apply route daily. Automatic blood pressure cuff regular size. To monitor blood pressure regularly at home. ICD-10 code:Z34.90, Disp: 1 each, Rfl: 0   Misc. Devices (GOJJI WEIGHT SCALE) MISC, 1 Device by Does not apply route daily as needed. To weight self daily as needed at home. ICD-10 code: Z34.90, Disp: 1 each, Rfl: 0   NIFEdipine (PROCARDIA) 10 MG capsule, Take 1 capsule (10 mg total) by mouth 3 (three) times daily. (Patient  not taking: No sig reported), Disp: 30 capsule, Rfl: 1   Prenatal Vit-Fe Fumarate-FA (PRENATAL MULTIVITAMIN) TABS tablet, Take 1 tablet by mouth daily at 12 noon., Disp: , Rfl:   Observations/Objective: Patient is well-developed, well-nourished in no acute distress.  Resting comfortably at home.  Head is normocephalic, atraumatic.  No labored breathing.  Speech is clear and coherent with logical  content.  Patient is alert and oriented at baseline.    Assessment and Plan: 1. Chlamydia - doxycycline (VIBRA-TABS) 100 MG tablet; Take 1 tablet (100 mg total) by mouth 2 (two) times daily.  Dispense: 14 tablet; Refill: 0  - Doxycycline prescribed - [facility]vised [facility] since it was just resulted today and she can inform them that she has been treated. - Discussed she can also be re-tested for test of cure 2 weeks after completing treatment if desired - Seek in person evaluation if symptoms worsen  Follow Up Instructions: I discussed the assessment and treatment plan with the patient. The patient was provided an opportunity to ask questions and all were answered. The patient agreed with the plan and demonstrated an understanding of the instructions.  A copy of instructions were sent to the patient via MyChart unless otherwise noted below.    The patient was advised to call back or seek an in-person evaluation if the symptoms worsen or if the condition fails to improve as anticipated.  Time:  I spent 10 minutes with the patient via telehealth technology discussing the above problems/concerns.    Margaretann Loveless, PA-C

## 2023-05-29 IMAGING — US US MFM OB DETAIL+14 WK
1 series · 13 of 28 positions shown · non-contrast
Comparison: none

[Series 1: us mfm ob detail+14 wk · 95 acquisitions, 13 frames shown]
[im 4/95]
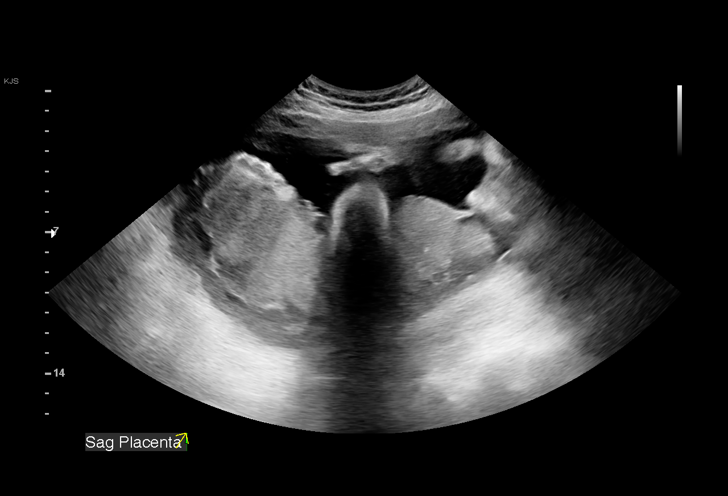
[im 11/95]
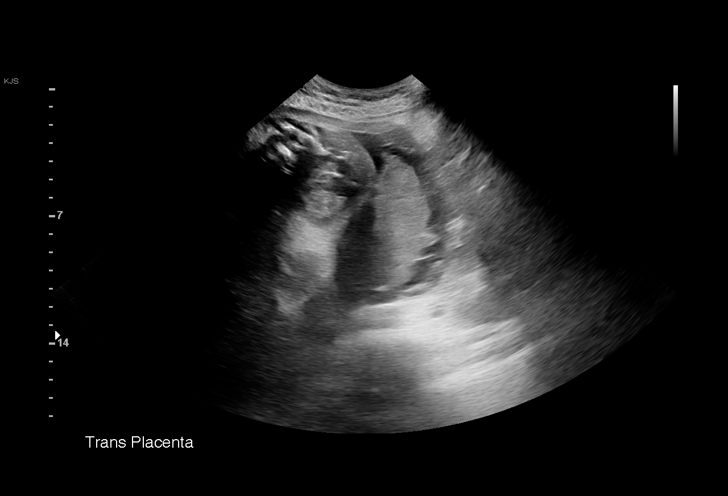
[im 18/95]
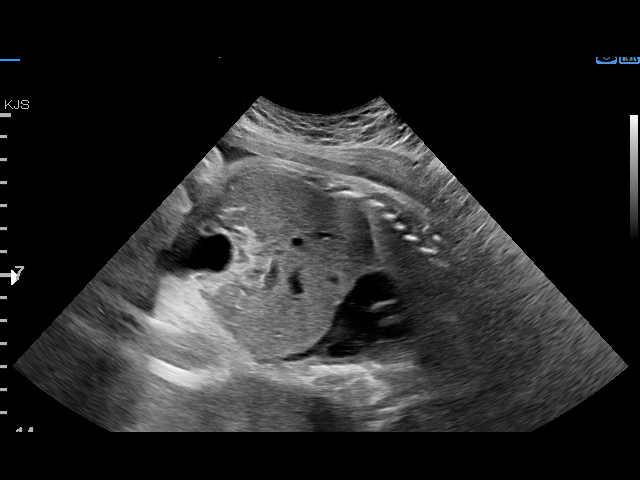
[im 25/95]
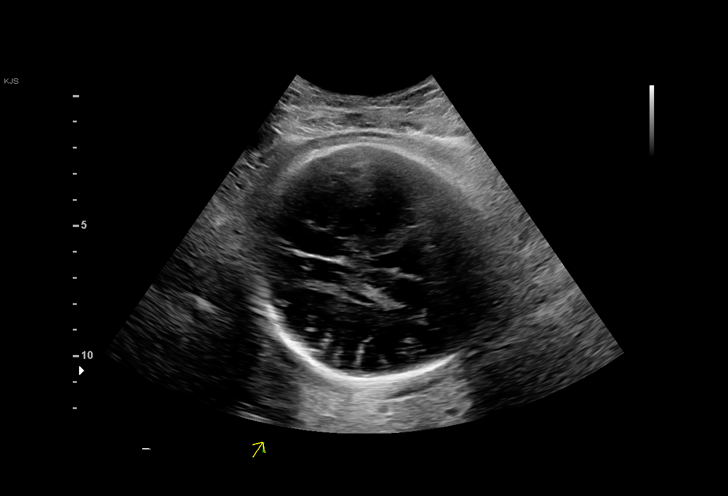
[im 32/95]
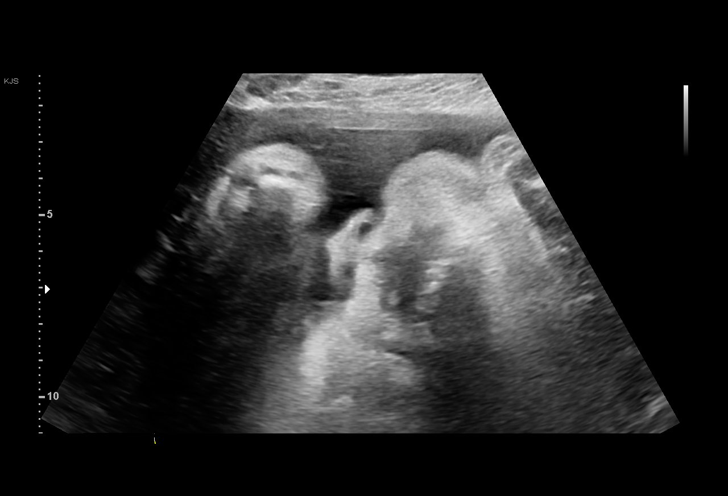
[im 39/95]
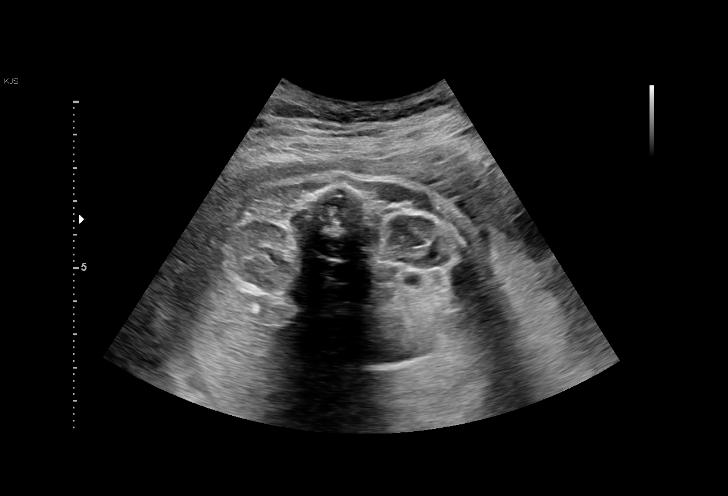
[im 49/95]
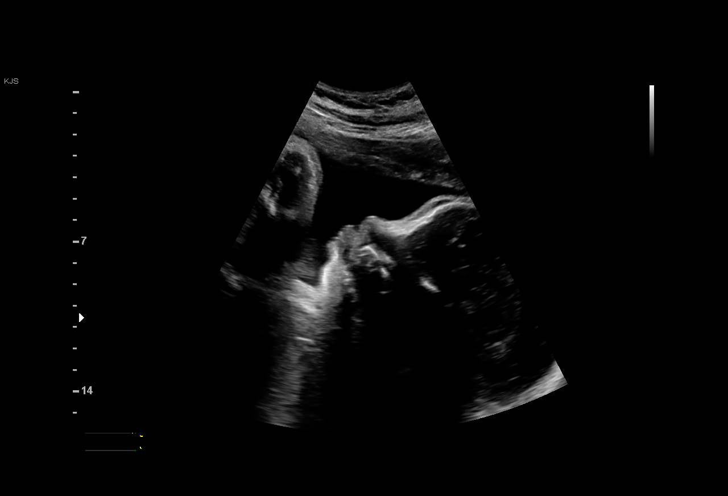
[im 56/95]
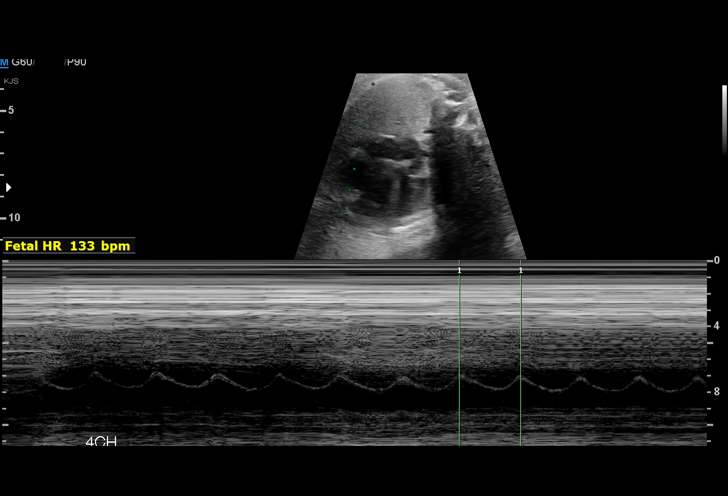
[im 63/95]
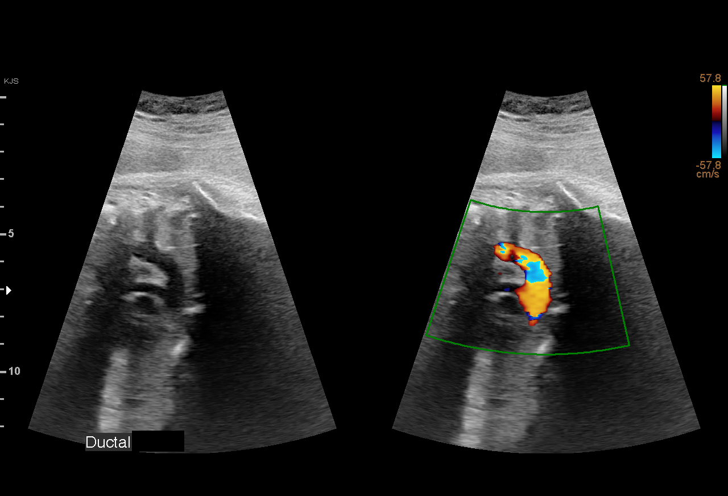
[im 70/95]
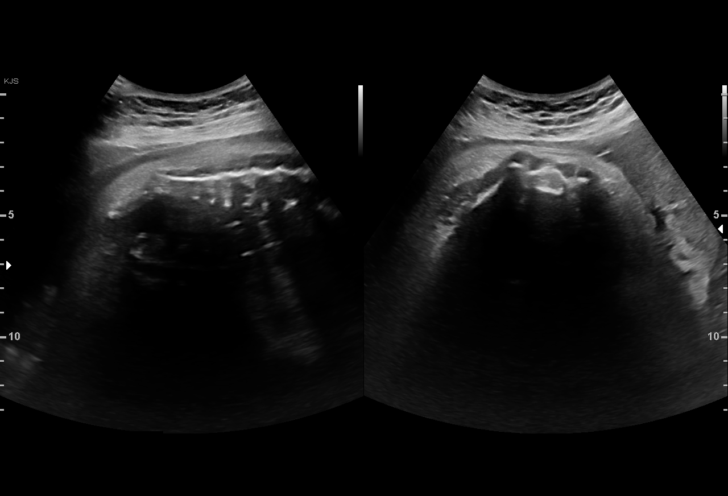
[im 77/95]
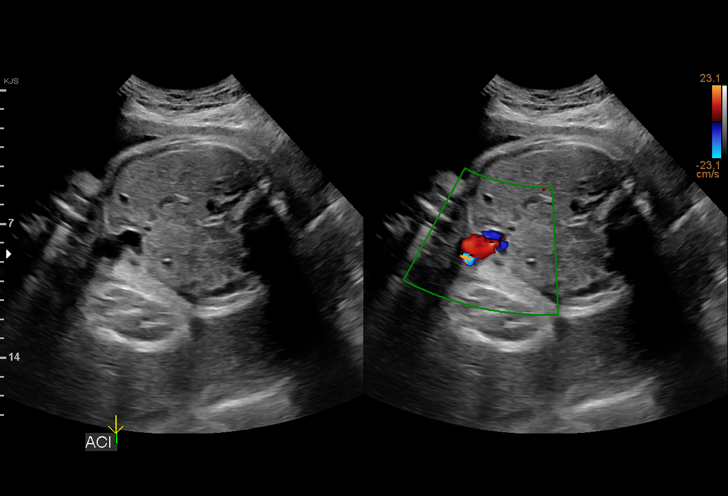
[im 84/95]
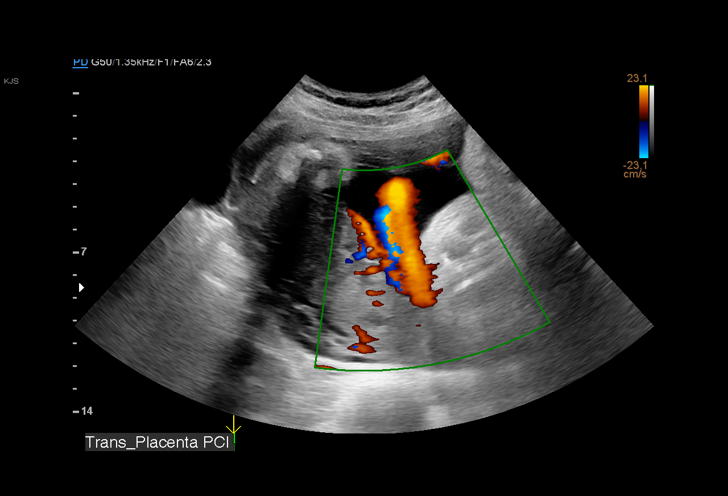
[im 91/95]
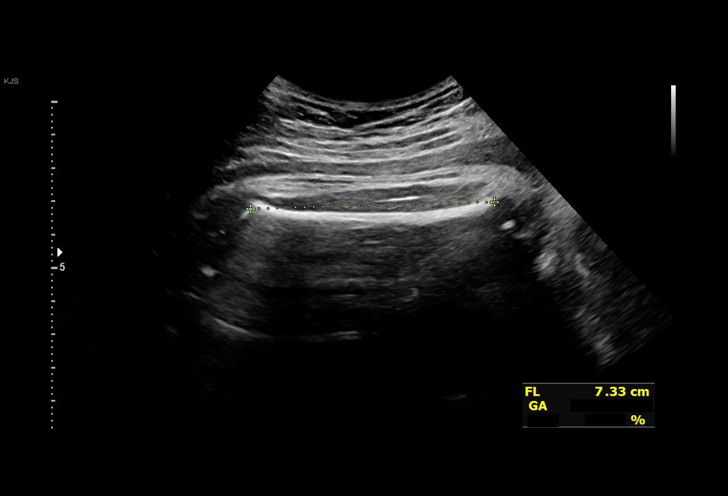

[13 of 28 positions shown; findings below may reference images not displayed]

CNM

Indications

 Obesity complicating pregnancy, third
 trimester (Pre-pregnancy BMI 39)
 37 weeks gestation of pregnancy
 Herpes simplex virus (VICTOR ALEX)
 Negative Horizon/ LR NIPS
 Encounter for antenatal screening for
 malformations
Fetal Evaluation

 Num Of Fetuses:          1
 Fetal Heart              133
 Rate(bpm):
 Cardiac Activity:        Observed
 Presentation:            Cephalic
 Placenta:                Posterior
 P. Cord Insertion:       Visualized, central

 Amniotic Fluid
 AFI FV:      Within normal limits

 AFI Sum(cm)     %Tile       Largest Pocket(cm)
 12.8            46

 RUQ(cm)       RLQ(cm)       LUQ(cm)        LLQ(cm)

Biometry

 BPD:      90.3  mm     G. Age:  36w 4d         45  %    CI:        77.54   %    70 - 86
                                                         FL/HC:      22.6   %    20.8 -
 HC:     324.6   mm     G. Age:  36w 5d         14  %    HC/AC:      1.02        0.92 -
 AC:     319.5   mm     G. Age:  35w 6d         22  %    FL/BPD:     81.2   %    71 - 87
 FL:       73.3  mm     G. Age:  37w 4d         53  %    FL/AC:      22.9   %    20 - 24
 HUM:      65.3  mm     G. Age:  37w 6d         82  %
 CER:      47.9  mm     G. Age:  36w 0d         31  %

 LV:        2.2  mm

 Est. FW:   0614    g      6 lb 8 oz     33  %
                    m
OB History

 Blood Type:   O+
 Gravidity:    2
 Ectopic:      1
Gestational Age

 LMP:           41w 0d        Date:  07/19/20                 EDD:   04/25/21
 Clinical EDD:  37w 4d                                        EDD:   05/19/21
 U/S Today:     36w 5d                                        EDD:   05/25/21
 Best:          37w 3d     Det. By:  Early Ultrasound          EDD:  05/20/21
                                     (09/28/20)
Anatomy

 Cranium:               Appears normal         Aortic Arch:            Appears normal
 Cavum:                 Appears normal         Ductal Arch:            Not well visualized
 Ventricles:            Appears normal         Diaphragm:              Appears normal
 Choroid Plexus:        Appears normal         Stomach:                Appears normal,
                                                                       left sided
 Cerebellum:            Appears normal         Abdomen:                Appears normal
 Posterior Fossa:       Not well visualized    Abdominal Wall:         Appears nml (cord
                                                                       insert, abd wall)
 Nuchal Fold:           Not applicable (>20    Cord Vessels:           Appears normal (3
                        wks GA)                                        vessel cord)
 Face:                  Appears normal         Kidneys:                Appear normal
                        (orbits and profile)
 Lips:                  Appears normal         Bladder:                Appears normal
 Thoracic:              Appears normal         Spine:                  Appears normal
 Heart:                 Not well visualized    Upper Extremities:      Visualized
 RVOT:                  Not well visualized    Lower Extremities:      Visualized
 LVOT:                  Not well visualized

 Other:  Technically difficult due to advanced GA and fetal position.
Cervix Uterus Adnexa

 Cervix
 Not visualized (advanced GA >39wks)
 Uterus
 No abnormality visualized.

 Right Ovary
 Not visualized.

 Left Ovary
 Not visualized.

 Cul De Sac
 No free fluid seen.

 Adnexa
 No abnormality visualized.
Comments

 This patient was seen for an exam today as she recently
 transferred her care for delivery at [HOSPITAL].  She reports
 that she has had normal ultrasounds performed by her prior
 OB/GYN in [place], [place] [HOSPITAL].  She denies any problems
 in her current pregnancy.
 She was informed that the fetal growth and amniotic fluid
 level appears appropriate for her gestational age.
 The views of the fetal anatomy were limited today due to her
 advanced gestational age.
 As the fetal growth is within normal limits, no further exams
 were scheduled in our office.
